# Patient Record
Sex: Male | Born: 1980 | Hispanic: Yes | Marital: Single | Smoking: Never smoker
Health system: Southern US, Community
[De-identification: ages and names within clinical notes are randomized; demographics above are authoritative.]

## PROBLEM LIST (undated history)

## (undated) DIAGNOSIS — M109 Gout, unspecified: Secondary | ICD-10-CM

## (undated) HISTORY — PX: FOREARM FRACTURE SURGERY: SHX649

---

## 2002-01-15 ENCOUNTER — Ambulatory Visit (HOSPITAL_BASED_OUTPATIENT_CLINIC_OR_DEPARTMENT_OTHER): Admission: RE | Admit: 2002-01-15 | Discharge: 2002-01-15 | Payer: Self-pay | Admitting: Orthopaedic Surgery

## 2013-09-14 ENCOUNTER — Ambulatory Visit (INDEPENDENT_AMBULATORY_CARE_PROVIDER_SITE_OTHER): Payer: Self-pay

## 2013-09-14 ENCOUNTER — Ambulatory Visit (INDEPENDENT_AMBULATORY_CARE_PROVIDER_SITE_OTHER): Payer: Self-pay | Admitting: Family Medicine

## 2013-09-14 VITALS — BP 122/76 | HR 70 | Temp 98.3°F | Resp 16 | Ht 65.5 in | Wt 191.0 lb

## 2013-09-14 DIAGNOSIS — R1012 Left upper quadrant pain: Secondary | ICD-10-CM

## 2013-09-14 DIAGNOSIS — M546 Pain in thoracic spine: Secondary | ICD-10-CM

## 2013-09-14 MED ORDER — OMEPRAZOLE 40 MG PO CPDR: 40.0000 mg | DELAYED_RELEASE_CAPSULE | Freq: Every day | ORAL | Status: DC

## 2013-09-14 NOTE — Patient Instructions (Signed)
Take the omeprazole 40 mg one time each day.  Things that often make reflux symptoms worse: Caffeine Carbonation (soda) Spicy foods Acidic foods (like tomato sauce, orange juice, lemonade) Fatty foods (including whole milk and ice cream) Stress (feeling sad, worried, nervous) Nicotine Alcohol NSAIDS (ibuprofen, naproxen)

## 2013-09-14 NOTE — Progress Notes (Signed)
Subjective:    Patient ID: Darren Rodgers, male    DOB: 1980/01/26, 33 y.o.   MRN: [id number]   PCP: No primary provider on file.  Chief Complaint  Patient presents with  . Abdominal Pain    x2-3 months, intermittent pain in LUQ.  . Back Pain    x2 weeks, mid to upper back pain on R side       Active Ambulatory Problems    Diagnosis Date Noted  . No Active Ambulatory Problems   Resolved Ambulatory Problems    Diagnosis Date Noted  . No Resolved Ambulatory Problems   No Additional Past Medical History    Past Surgical History  Procedure Laterality Date  . Forearm fracture surgery Left     No Known Allergies  Prior to Admission medications   Not on File    History   Social History  . Marital Status: Single    Spouse Name: n/a    Number of Children: 0  . Years of Education: 8th grade   Occupational History  . Construction    Social History Main Topics  . Smoking status: Never Smoker   . Smokeless tobacco: Never Used  . Alcohol Use: 5.0 oz/week    10 drink(s) per week  . Drug Use: No  . Sexual Activity: None   Other Topics Concern  . None   Social History Narrative   From [place] (near [place]). Came to the [place] in 2005. Lives with a friend.    family history is not on file. indicated that his mother is deceased. He indicated that his father is alive. He indicated that all of his four sisters are alive. He indicated that all of his five brothers are alive.   HPI  Presents for evaluation of LUQ abdominal pain and RIGHT shoulder/upper back pain. There is a considerable language barrier. When I speak to him in Spanish, he replies in [place] which is very difficult to understand.  1. 3-4 months of LUQ abdominal pain. Comes and goes.  Present some days, some not.  Pain is an ache, not sharp, burning or stabbing.  No aggravating or alleviating factors.  No heartburn or indigestion, belching, increased flatulence.  Reports normal, regular bowel movements. No  melena/hematochezia. Has tried nothing to treat his pain.  He reports he had some labs and xrays done several months ago, but he's not sure what they were other than cholesterol and reports that they were "all normal."   2. RIGHT upper back pain.  He points to the mid to lower scapula.  Pain with raising the arm. No other alleviating or aggravating factors.  Has tried nothing to treat his pain. Recalls no injury.  No pain in the arm, no numbness, tingling or weakness.  Neither of these complaints has altered his ability to work or perform any of his ADLs. He is concerned that these symptoms indicate he has an appendicitis, which is why he came today.  Review of Systems As above. No fever, chills, nausea, vomiting, diarrhea, urinary urgency, frequency or burning. No other muscle or joint pain.    Objective:   Physical Exam  Constitutional: He is oriented to person, place, and time. He appears well-developed and well-nourished. He is active and cooperative. No distress.  BP 122/76  Pulse 70  Temp(Src) 98.3 F (36.8 C) (Oral)  Resp 16  Ht 5' 5.5" (1.664 m)  Wt 191 lb (86.637 kg)  BMI 31.29 kg/m2  SpO2 96%   HENT:  Head: Normocephalic and atraumatic.  Eyes: Conjunctivae are normal. No scleral icterus.  Neck: Neck supple. No thyromegaly present.  Cardiovascular: Normal rate, regular rhythm and normal heart sounds.   Pulmonary/Chest: Effort normal and breath sounds normal.  Abdominal: Soft. He exhibits no distension and no mass. Bowel sounds are increased. There is no hepatosplenomegaly. There is tenderness (mild) in the left upper quadrant. There is no rebound, no guarding and no CVA tenderness.  Lymphadenopathy:    He has no cervical adenopathy.  Neurological: He is alert and oriented to person, place, and time.  Skin: Skin is warm and dry.  Psychiatric: He has a normal mood and affect. His speech is normal and behavior is normal.    ACUTE ABDOMINAL SERIES: [facility] reading (PRIMARY) by   Dr. Neva Seat. Non-specific bowel gas pattern. No free air. No ileus or evidence of obstruction.        Assessment & Plan:  1. Right-sided thoracic back pain 2. Abdominal pain, left upper quadrant Once I assured him that the appendix was unlikely to cause his symptoms, he was relieved and desired no additional evaluation.  I encouraged him to try Omeprazole 40 mg daily for 2 weeks, and to RTC if symptoms worsen/persist. If he does return, please bring the results of other studies done for evaluation and comparison. - DG Abd Acute W/Chest; Future - omeprazole (PRILOSEC) 40 MG capsule; Take 1 capsule (40 mg total) by mouth daily.  Dispense: 15 capsule; Refill: 0   Fernande Bras, PA-C Physician Assistant-Certified [facility]

## 2013-09-14 NOTE — Progress Notes (Signed)
Xray read and patient discussed with Chelle Jeffery, PA-C. . Agree with assessment and plan of care per her note.   

## 2015-04-18 ENCOUNTER — Emergency Department (HOSPITAL_COMMUNITY): Payer: Self-pay

## 2015-04-18 ENCOUNTER — Emergency Department (HOSPITAL_COMMUNITY)
Admission: EM | Admit: 2015-04-18 | Discharge: 2015-04-18 | Disposition: A | Discharge status: Home / Self Care | Payer: Self-pay | Attending: Emergency Medicine | Admitting: Emergency Medicine

## 2015-04-18 DIAGNOSIS — X58XXXA Exposure to other specified factors, initial encounter: Secondary | ICD-10-CM | POA: Insufficient documentation

## 2015-04-18 DIAGNOSIS — S39012A Strain of muscle, fascia and tendon of lower back, initial encounter: Secondary | ICD-10-CM | POA: Insufficient documentation

## 2015-04-18 DIAGNOSIS — Y9289 Other specified places as the place of occurrence of the external cause: Secondary | ICD-10-CM | POA: Insufficient documentation

## 2015-04-18 DIAGNOSIS — Y998 Other external cause status: Secondary | ICD-10-CM | POA: Insufficient documentation

## 2015-04-18 DIAGNOSIS — Y9389 Activity, other specified: Secondary | ICD-10-CM | POA: Insufficient documentation

## 2015-04-18 DIAGNOSIS — R109 Unspecified abdominal pain: Secondary | ICD-10-CM | POA: Insufficient documentation

## 2015-04-18 LAB — URINALYSIS, ROUTINE W REFLEX MICROSCOPIC
Bilirubin Urine: NEGATIVE
Glucose, UA: NEGATIVE mg/dL
HGB URINE DIPSTICK: NEGATIVE
Ketones, ur: NEGATIVE mg/dL
Leukocytes, UA: NEGATIVE
Nitrite: NEGATIVE
PROTEIN: NEGATIVE mg/dL
Specific Gravity, Urine: 1.018 (ref 1.005–1.030)
pH: 5.5 (ref 5.0–8.0)

## 2015-04-18 MED ORDER — NAPROXEN 500 MG PO TABS: 500.0000 mg | ORAL_TABLET | Freq: Two times a day (BID) | ORAL | Status: DC

## 2015-04-18 MED ORDER — METHOCARBAMOL 500 MG PO TABS: 1000.0000 mg | ORAL_TABLET | Freq: Four times a day (QID) | ORAL | Status: DC

## 2015-04-18 NOTE — ED Notes (Addendum)
Pt reports L flank pain for 4 days. No blood in urine. Pt request spanish interpreter.

## 2015-04-18 NOTE — ED Notes (Signed)
Pa  at bedside. 

## 2015-04-18 NOTE — ED Notes (Signed)
Patient transported to X-ray 

## 2015-04-18 NOTE — ED Notes (Signed)
Pa  at bedside. Using interpetor

## 2015-04-18 NOTE — ED Notes (Signed)
Registration at bedside.

## 2015-04-18 NOTE — ED Provider Notes (Signed)
CSN: [id number]     Arrival date & time 04/18/15  1008 History   First MD Initiated Contact with Patient 04/18/15 1028     Chief Complaint  Patient presents with  . Flank Pain     (Consider location/radiation/quality/duration/timing/severity/associated sxs/prior Treatment) HPI Comments: Patient presents with complaint of left sided low back pain for the past 2 weeks. Patient is a Corporate investment banker. He denies acute injuries. Patient had similar back pain 6 months ago. Pain is worsened with lifting and movement. He took an unknown over-the-counter pain medication without relief. Pain is sharp in nature. No radiation into legs. Patient denies warning symptoms of back pain including: fecal incontinence, urinary retention or overflow incontinence, night sweats, waking from sleep with back pain, unexplained fevers or weight loss, h/o cancer, IVDU, recent trauma.     The history is provided by the patient. The history is limited by a language barrier. A language interpreter was used (video interpreter).    No past medical history on file. Past Surgical History  Procedure Laterality Date  . Forearm fracture surgery Left    No family history on file. Social History  Substance Use Topics  . Smoking status: Never Smoker   . Smokeless tobacco: Never Used  . Alcohol Use: 5.0 oz/week    10 drink(s) per week    Review of Systems  Constitutional: Negative for fever and unexpected weight change.  Gastrointestinal: Negative for constipation.       Neg for fecal incontinence  Genitourinary: Negative for hematuria, flank pain and difficulty urinating.       Negative for urinary incontinence or retention  Musculoskeletal: Positive for back pain.  Neurological: Negative for weakness and numbness.       Negative for saddle paresthesias       Allergies  Review of patient's allergies indicates no known allergies.  Home Medications   Prior to Admission medications   Medication Sig Start Date  End Date Taking? Authorizing Provider  polyvinyl alcohol (LIQUIFILM TEARS) 1.4 % ophthalmic solution Place 1 drop into both eyes daily as needed for dry eyes.   Yes Historical Provider, MD  omeprazole (PRILOSEC) 40 MG capsule Take 1 capsule (40 mg total) by mouth daily. Patient not taking: Reported on 04/18/2015 09/14/13   Chelle Jeffery, PA-C   BP 113/93 mmHg  Pulse 87  Temp(Src) 98 F (36.7 C) (Oral)  Resp 17  SpO2 97% Physical Exam  Constitutional: He appears well-developed and well-nourished.  HENT:  Head: Normocephalic and atraumatic.  Eyes: Conjunctivae are normal.  Neck: Normal range of motion.  Abdominal: Soft. There is no tenderness. There is no CVA tenderness.  Musculoskeletal: Normal range of motion.       Cervical back: Normal.       Thoracic back: Normal.       Lumbar back: He exhibits tenderness.       Back:  No step-off noted with palpation of spine.   Neurological: He is alert. He has normal reflexes. No sensory deficit. He exhibits normal muscle tone.  5/5 strength in entire lower extremities bilaterally. No sensation deficit.   Skin: Skin is warm and dry.  Psychiatric: He has a normal mood and affect.  Nursing note and vitals reviewed.   ED Course  Procedures (including critical care time) Labs Review Labs Reviewed  URINALYSIS, ROUTINE W REFLEX MICROSCOPIC (NOT AT [facility])    Imaging Review Dg Lumbar Spine Complete  04/18/2015  CLINICAL DATA:  Left flank pain for 1 week after lifting  injury at work. EXAM: LUMBAR SPINE - COMPLETE 4+ VIEW COMPARISON:  None. FINDINGS: No fracture.  No spondylolisthesis. Mild loss of disc height from L2-L3 through L4-L5. Minor endplate spurring. Facet joints are well preserved. Soft tissues are unremarkable. IMPRESSION: 1. No fracture or acute finding. 2. Mild degenerative changes. Electronically Signed   By: Amie Portlandavid  Ormond M.D.   On: 04/18/2015 11:51   I have personally reviewed and evaluated these images and lab results as part of my  medical decision-making.  11:09 AM Patient seen and examined. Used video interpreter. Patient requests x-ray of his back as he is concerned about his pain. I discussed that patient likely has muscle strain and that x-ray will likely be negative. Patient continues to request x-ray so will order for patient's peace of mind.   Vital signs reviewed and are as follows: BP 113/93 mmHg  Pulse 87  Temp(Src) 98 F (36.7 C) (Oral)  Resp 17  SpO2 97%   11:58 AM X-ray shows mild denegerative changes, no fractures. No red flag s/s of low back pain. Patient was counseled on back pain precautions and told to do activity as tolerated but do not lift, push, or pull heavy objects more than 10 pounds for the next week.  Patient counseled to use ice or heat on back for no longer than 15 minutes every hour.   Patient prescribed muscle relaxer and counseled on proper use of muscle relaxant medication.    Urged patient not to drink alcohol, drive, or perform any other activities that requires focus while taking these medications.  Patient urged to follow-up with PCP if pain does not improve with treatment and rest or if pain becomes recurrent. Urged to return with worsening severe pain, loss of bowel or bladder control, trouble walking.   The patient verbalizes understanding and agrees with the plan.    MDM   Final diagnoses:  Lumbosacral strain, initial encounter   Patient with back pain. Neg x-ray for acute injury. No neurological deficits. Patient is ambulatory. No warning symptoms of back pain including: fecal incontinence, urinary retention or overflow incontinence, night sweats, waking from sleep with back pain, unexplained fevers or weight loss, h/o cancer, IVDU, recent trauma. No concern for cauda equina, epidural abscess, or other serious cause of back pain. Conservative measures such as rest, ice/heat and pain medicine indicated with PCP follow-up if no improvement with conservative management.      Renne CriglerJoshua Takhia Spoon, PA-C 04/18/15 1159  Vanetta MuldersScott Zackowski, MD 04/19/15 [phone]

## 2015-04-18 NOTE — Discharge Instructions (Signed)
Please read and follow all provided instructions.  Your diagnoses today include:  1. Lumbosacral strain, initial encounter    Tests performed today include:  Vital signs - see below for your results today  X-ray - shows some mild degeneration but no broken bones  Medications prescribed:   Naproxen - anti-inflammatory pain medication  Do not exceed 500mg  naproxen every 12 hours, take with food  You have been prescribed an anti-inflammatory medication or NSAID. Take with food. Take smallest effective dose for the shortest duration needed for your pain. Stop taking if you experience stomach pain or vomiting.    Robaxin (methocarbamol) - muscle relaxer medication  DO NOT drive or perform any activities that require you to be awake and alert because this medicine can make you drowsy.   Take any prescribed medications only as directed.  Home care instructions:   Follow any educational materials contained in this packet  Please rest, use ice or heat on your back for the next several days  Do not lift, push, pull anything more than 10 pounds for the next week  Follow-up instructions: Please follow-up with your primary care provider in the next 1 week for further evaluation of your symptoms.   Return instructions:  SEEK IMMEDIATE MEDICAL ATTENTION IF YOU HAVE:  New numbness, tingling, weakness, or problem with the use of your arms or legs  Severe back pain not relieved with medications  Loss control of your bowels or bladder  Increasing pain in any areas of the body (such as chest or abdominal pain)  Shortness of breath, dizziness, or fainting.   Worsening nausea (feeling sick to your stomach), vomiting, fever, or sweats  Any other emergent concerns regarding your health   Additional Information:  Your vital signs today were: BP 113/93 mmHg   Pulse 87   Temp(Src) 98 F (36.7 C) (Oral)   Resp 17   SpO2 97% If your blood pressure (BP) was elevated above 135/85 this  visit, please have this repeated by your doctor within one month. --------------

## 2016-09-10 ENCOUNTER — Ambulatory Visit (HOSPITAL_COMMUNITY)
Admission: EM | Admit: 2016-09-10 | Discharge: 2016-09-10 | Disposition: A | Discharge status: Home / Self Care | Payer: Self-pay | Attending: Internal Medicine | Admitting: Internal Medicine

## 2016-09-10 ENCOUNTER — Encounter (HOSPITAL_COMMUNITY): Payer: Self-pay | Admitting: Family Medicine

## 2016-09-10 DIAGNOSIS — H66001 Acute suppurative otitis media without spontaneous rupture of ear drum, right ear: Secondary | ICD-10-CM

## 2016-09-10 MED ORDER — AMOXICILLIN 500 MG PO CAPS: 1000.0000 mg | ORAL_CAPSULE | Freq: Three times a day (TID) | ORAL | 0 refills | Status: AC

## 2016-09-10 NOTE — ED Provider Notes (Signed)
[facility]    CSN: [id number] Arrival date & time: 09/10/16  1212     History   Chief Complaint Chief Complaint  Patient presents with  . Otalgia    HPI Darren Rodgers is a 36 y.o. male.   Presents today for bilateral otalgia 4 days, right ear is worse than the left. Denies any hearing loss, tinnitus or ear drainage. Denies any coughing, nasal congestion, sneezing, sore throat or headache. Also denies dental pain. No fever reported at home.        History reviewed. No pertinent past medical history.  There are no active problems to display for this patient.   Past Surgical History:  Procedure Laterality Date  . FOREARM FRACTURE SURGERY Left        Home Medications    Prior to Admission medications   Medication Sig Start Date End Date Taking? Authorizing Provider  amoxicillin (AMOXIL) 500 MG capsule Take 2 capsules (1,000 mg total) by mouth 3 (three) times daily. 09/10/16 09/17/16  Lucia EstelleZheng, Montee Tallman, NP  methocarbamol (ROBAXIN) 500 MG tablet Take 2 tablets (1,000 mg total) by mouth 4 (four) times daily. 04/18/15   Renne CriglerGeiple, Joshua, PA-C  naproxen (NAPROSYN) 500 MG tablet Take 1 tablet (500 mg total) by mouth 2 (two) times daily. 04/18/15   Renne CriglerGeiple, Joshua, PA-C  polyvinyl alcohol (LIQUIFILM TEARS) 1.4 % ophthalmic solution Place 1 drop into both eyes daily as needed for dry eyes.    [provider]    Family History History reviewed. No pertinent family history.  Social History Social History  Substance Use Topics  . Smoking status: Never Smoker  . Smokeless tobacco: Never Used  . Alcohol use 5.0 oz/week    10 drink(s) per week     Allergies   Patient has no known allergies.   Review of Systems Review of Systems  All other systems reviewed and are negative.    Physical Exam Triage Vital Signs ED Triage Vitals [09/10/16 1252]  Enc Vitals Group     BP 128/67     Pulse Rate 67     Resp 18     Temp 98.6 F (37 C)     Temp src      SpO2  97 %     Weight      Height      Head Circumference      Peak Flow      Pain Score      Pain Loc      Pain Edu?      Excl. in GC?    No data found.   Updated Vital Signs BP 128/67   Pulse 67   Temp 98.6 F (37 C)   Resp 18   SpO2 97%   Visual Acuity Right Eye Distance:   Left Eye Distance:   Bilateral Distance:    Right Eye Near:   Left Eye Near:    Bilateral Near:     Physical Exam  Constitutional: He is oriented to person, place, and time. He appears well-developed and well-nourished.  HENT:  Head: Normocephalic and atraumatic.  Nose: Nose normal.  Mouth/Throat: Oropharynx is clear and moist. No oropharyngeal exudate.  Left ear: Normal Right ear: TM is erythematous but no bulging or perforation.  Cardiovascular: Normal rate, regular rhythm and normal heart sounds.   Pulmonary/Chest: Effort normal and breath sounds normal. He has no wheezes.  Neurological: He is alert and oriented to person, place, and time.  Nursing note and vitals  reviewed.    UC Treatments / Results  Labs (all labs ordered are listed, but only abnormal results are displayed) Labs Reviewed - No data to display  EKG  EKG Interpretation None       Radiology No results found.  Procedures Procedures (including critical care time)  Medications Ordered in UC Medications - No data to display   Initial Impression / Assessment and Plan / UC Course  I have reviewed the triage vital signs and the nursing notes.  Pertinent labs & imaging results that were available during my care of the patient were reviewed by me and considered in my medical decision making (see chart for details).     Final Clinical Impressions(s) / UC Diagnoses   Final diagnoses:  Acute suppurative otitis media of right ear without spontaneous rupture of tympanic membrane, recurrence not specified   Prescriptions given (see below). Reviewed directions for usage and side effects. Patient states understanding and  will call with questions or problems. Patient instructed to call or follow up with his/her primary care doctor if failure to improve or change in symptoms. Discharge instruction given.   New Prescriptions New Prescriptions   AMOXICILLIN (AMOXIL) 500 MG CAPSULE    Take 2 capsules (1,000 mg total) by mouth 3 (three) times daily.     Controlled Substance Prescriptions [place] Controlled Substance Registry consulted? Not Applicable   Lucia Estelle, NP 09/10/16 1313

## 2016-09-10 NOTE — ED Triage Notes (Signed)
Pt here for bilateral ear pain x 4 days. Worse on the right.

## 2016-10-04 ENCOUNTER — Ambulatory Visit (HOSPITAL_COMMUNITY)
Admission: EM | Admit: 2016-10-04 | Discharge: 2016-10-04 | Disposition: A | Discharge status: Home / Self Care | Payer: Self-pay | Attending: Urgent Care | Admitting: Urgent Care

## 2016-10-04 ENCOUNTER — Encounter (HOSPITAL_COMMUNITY): Payer: Self-pay | Admitting: Emergency Medicine

## 2016-10-04 DIAGNOSIS — M79675 Pain in left toe(s): Secondary | ICD-10-CM | POA: Insufficient documentation

## 2016-10-04 DIAGNOSIS — R2242 Localized swelling, mass and lump, left lower limb: Secondary | ICD-10-CM

## 2016-10-04 DIAGNOSIS — M79672 Pain in left foot: Secondary | ICD-10-CM

## 2016-10-04 DIAGNOSIS — M7989 Other specified soft tissue disorders: Secondary | ICD-10-CM | POA: Insufficient documentation

## 2016-10-04 LAB — POCT I-STAT, CHEM 8
BUN: 14 mg/dL (ref 6–20)
CHLORIDE: 103 mmol/L (ref 101–111)
CREATININE: 0.9 mg/dL (ref 0.61–1.24)
Calcium, Ion: 1.14 mmol/L — ABNORMAL LOW (ref 1.15–1.40)
Glucose, Bld: 105 mg/dL — ABNORMAL HIGH (ref 65–99)
HEMATOCRIT: 44 % (ref 39.0–52.0)
Hemoglobin: 15 g/dL (ref 13.0–17.0)
POTASSIUM: 3.9 mmol/L (ref 3.5–5.1)
Sodium: 141 mmol/L (ref 135–145)
TCO2: 26 mmol/L (ref 22–32)

## 2016-10-04 LAB — URIC ACID: Uric Acid, Serum: 10 mg/dL — ABNORMAL HIGH (ref 4.4–7.6)

## 2016-10-04 MED ORDER — COLCHICINE 0.6 MG PO TABS: ORAL_TABLET | ORAL | 1 refills | Status: DC

## 2016-10-04 NOTE — ED Triage Notes (Signed)
Pt. Stated, left foot pain, start this morning

## 2016-10-04 NOTE — ED Provider Notes (Signed)
    MRN: [id number] DOB: Oct 20, 1980  Subjective:   Darren Rodgers is a 36 y.o. male presenting for chief complaint of Foot Pain  Reports sudden onset of constant, worsening, sharp pain over dorsum of his left foot. Denies fever, drainage of pus, bleeding, trauma. Denies history of gout. He thought it might have been an insect bite but does not recall any particular incident. Patient binge drinks on the weekends, eats a lot of red meat.   Darren Rodgers is not currently taking any medications and has No Known Allergies.  Darren Rodgers  Also  has a past surgical history that includes Forearm fracture surgery (Left).  Objective:   Vitals: BP 124/89 (BP Location: Right Arm)   Pulse 70   Temp 99.1 F (37.3 C) (Oral)   Resp 17   Wt 209 lb (94.8 kg)   SpO2 100%   BMI 34.25 kg/m   Physical Exam  Constitutional: He is oriented to person, place, and time. He appears well-developed and well-nourished.  Cardiovascular: Normal rate.   Pulmonary/Chest: Effort normal.  Musculoskeletal:       Feet:  Neurological: He is alert and oriented to person, place, and time.   Results for orders placed or performed during the hospital encounter of 10/04/16 (from the past 24 hour(s))  I-STAT, chem 8     Status: Abnormal   Collection Time: 10/04/16  8:18 PM  Result Value Ref Range   Sodium 141 135 - 145 mmol/L   Potassium 3.9 3.5 - 5.1 mmol/L   Chloride 103 101 - 111 mmol/L   BUN 14 6 - 20 mg/dL   Creatinine, Ser 4.09 0.61 - 1.24 mg/dL   Glucose, Bld 811 (H) 65 - 99 mg/dL   Calcium, Ion 9.14 (L) 1.15 - 1.40 mmol/L   TCO2 26 22 - 32 mmol/L   Hemoglobin 15.0 13.0 - 17.0 g/dL   HCT 78.2 95.6 - 21.3 %   Assessment and Plan :   Great toe pain, left  Pain and swelling of toe of left foot  Physical exam findings and history consistent with gout. Counseled patient on diagnosis and treatment plan including dietary modifications. Return-to-clinic precautions discussed, patient verbalized understanding.    Wallis Bamberg, PA-C [facility]  10/04/2016  8:08 PM   Wallis Bamberg, PA-C 10/05/16 2027

## 2016-10-04 NOTE — ED Notes (Signed)
Patient discharged by provider.

## 2016-10-04 NOTE — Discharge Instructions (Signed)
Toma 2 pastillas ahorita. Y luego una pastilla mas en Georgianne Fick. No tomas mas de este medicamento hasta que pasen 3 dias.

## 2016-10-27 ENCOUNTER — Encounter (HOSPITAL_COMMUNITY): Payer: Self-pay | Admitting: *Deleted

## 2016-10-27 ENCOUNTER — Ambulatory Visit (HOSPITAL_COMMUNITY)
Admission: EM | Admit: 2016-10-27 | Discharge: 2016-10-27 | Disposition: A | Discharge status: Home / Self Care | Payer: Self-pay | Attending: Emergency Medicine | Admitting: Emergency Medicine

## 2016-10-27 DIAGNOSIS — M109 Gout, unspecified: Secondary | ICD-10-CM

## 2016-10-27 MED ORDER — PREDNISONE 20 MG PO TABS: ORAL_TABLET | ORAL | 0 refills | Status: DC

## 2016-10-27 MED ORDER — INDOMETHACIN ER 75 MG PO CPCR: 75.0000 mg | ORAL_CAPSULE | Freq: Two times a day (BID) | ORAL | 0 refills | Status: DC

## 2016-10-27 NOTE — ED Triage Notes (Signed)
Patient reports 1 month history of left foot pain. Patient reports pain with ambulation. Patient points to left big toe.

## 2016-10-27 NOTE — ED Provider Notes (Signed)
HPI  SUBJECTIVE:  Darren Rodgers is a 36 y.o. male who presents with  Nonmigratory, stabbing, constant pain in his left first MTP joint starting yesterday. He reports some erythema, swelling and hyperesthesias. No trauma to the foot, change in physical activity. No fevers. He tried colchicine with some improvement in his symptoms. Symptoms are worse with moving his toe. No fevers. He was seen here back in September for the exact same thing, thought to have gout. Uric acid was 10. States that he has been eating a lot of red meat recently. No alcohol in the past month. States this pain is identical to the pain that he had when he was presumptively diagnosed with gout. States it is not as severe this time because of the colchicine. No history of diabetes, hypertension, kidney disease. Family history negative for gout. PMD: None. All history obtained through language line.    History reviewed. No pertinent past medical history.  Past Surgical History:  Procedure Laterality Date  . FOREARM FRACTURE SURGERY Left     History reviewed. No pertinent family history.  Social History  Substance Use Topics  . Smoking status: Never Smoker  . Smokeless tobacco: Never Used  . Alcohol use 5.0 oz/week    10 drink(s) per week    No current facility-administered medications for this encounter.   Current Outpatient Prescriptions:  .  colchicine 0.6 MG tablet, Take 2 tablets now followed by 1 in an hour. Do not repeat this regimen for another 3 days., Disp: 30 tablet, Rfl: 1 .  indomethacin (INDOCIN SR) 75 MG CR capsule, Take 1 capsule (75 mg total) by mouth 2 (two) times daily with a meal., Disp: 21 capsule, Rfl: 0 .  polyvinyl alcohol (LIQUIFILM TEARS) 1.4 % ophthalmic solution, Place 1 drop into both eyes daily as needed for dry eyes., Disp: , Rfl:  .  predniSONE (DELTASONE) 20 MG tablet, 2 tabs po once daily x 3 days, Disp: 6 tablet, Rfl: 0  No Known Allergies   ROS  As noted in HPI.   Physical  Exam  BP (!) 134/94 (BP Location: Left Arm)   Pulse 60   Temp 98.4 F (36.9 C) (Oral)   Resp 17   SpO2 100%   Constitutional: Well developed, well nourished, no acute distress Eyes:  EOMI, conjunctiva normal bilaterally HENT: Normocephalic, atraumatic,mucus membranes moist Respiratory: Normal inspiratory effort Cardiovascular: Normal rate GI: nondistended skin: No rash, skin intact Musculoskeletal: no deformities. Pain, tenderness at the left first MTP. No appreciable erythema. Mild swelling. No increased temperature. Rest of foot exam normal. Neurologic: Alert & oriented x 3, no focal neuro deficits Psychiatric: Speech and behavior appropriate   ED Course   Medications - No data to display  No orders of the defined types were placed in this encounter.   No results found for this or any previous visit (from the past 24 hour(s)). No results found.  ED Clinical Impression  Acute gout involving toe of left foot, unspecified cause   ED Assessment/Plan  previous labs and records reviewed. As noted in history of present illness.  Today's Presentation consistent with gout. No evidence of septic joint. Imaging withheld due to absence of trauma.  Plan to send home with prednisone and indomethacin. He has plenty of colchicine and has a refill available. Explained how to take the colchicine to the patient using a translator. Will provide primary care referral for routine care..   Discussed labs,  MDM, plan and followup with patient. Patient agrees  with plan.   Meds ordered this encounter  Medications  . DISCONTD: indomethacin (INDOCIN SR) 75 MG CR capsule    Sig: Take 1 capsule (75 mg total) by mouth 2 (two) times daily with a meal.    Dispense:  21 capsule    Refill:  0  . predniSONE (DELTASONE) 20 MG tablet    Sig: 2 tabs po once daily x 3 days    Dispense:  6 tablet    Refill:  0  . indomethacin (INDOCIN SR) 75 MG CR capsule    Sig: Take 1 capsule (75 mg total) by  mouth 2 (two) times daily with a meal.    Dispense:  21 capsule    Refill:  0    *This clinic note was created using Scientist, clinical (histocompatibility and immunogenetics). Therefore, there may be occasional mistakes despite careful proofreading.  ?   Domenick Gong, MD 10/27/16 [phone]

## 2016-10-27 NOTE — Discharge Instructions (Signed)
Below is a list of primary care practices who are taking new patients for you to follow-up with. [facility] [address] [place], [place] [address] [phone]  [facility] [phone] [address] [place] [address]  [facility]: [address] [place] [id number]  [phone]  [facility]: [address] [place] [id number]   [phone]  [facility]: [address] [place] [address]  [phone]  [facility] : [address] [place] [phone]  [facility]: [address] [place] [phone]  [facility]: [address] [place] [id number] [phone]  Dr. Oneal GroutMahima Pandey [address] [facility] [address] [facility] [place] [id number]  [phone]  Dr. Jackie PlumGeorge Osei-Bonsu, [facility]. [address] [place], [place] [address]  [phone]  Go to www.goodrx.com to look up your medications. This will give you a list of where you can find your prescriptions at the most affordable prices. Or ask the pharmacist what the cash price is, or if they have any other discount programs available to help make your medication more affordable. This can be less expensive than what you would pay with insurance.    Dietary treatment plays a supplementary role in treatment of gout.  Diet can be expected to decrease the uric acid level by 10 to 15%.  Often medication can effect a much more substantial reduction.  An extremely restrictive diet is not necessary, but here are a few suggestions that might help decrease the frequency of gout attacks.  The first and most important measure is to achieve and maintain a healthy body weight  (BMI of 20 to 25).  It's been shown that people with a BMI over 25 have an increased risk of gout attacks compared to people with a BMI of less than 25.  Also, gout patients who lose as little as 4.5 kg or 9.9 lbs will decrease their risk of gout attacks.  Beyond that, here are a few general guidelines:  Eat less: Red meat Seafood Beer and hard alcohol (e.g. gin, vodka, whiskey) Foods that contain high fructose corn syrup (found in sweets and non-diet sodas) Organ meats (liver, kidneys, brains, sweetbreads) or foods made from these meats (hot dogs, bologna).  Eat more: Low fat dairy products A moderate amount of wine (up to two 5 oz servings per day) is not likely to increase the risk of a gout attack. Coffee may decrease the risk of gout attacks Vitamin C (500 mg per day) has a mild urate lowering effect

## 2016-10-27 NOTE — ED Notes (Addendum)
Patient still has some colchicine left, he was not sure if was expired or not.

## 2017-05-19 ENCOUNTER — Encounter (HOSPITAL_COMMUNITY): Payer: Self-pay | Admitting: Emergency Medicine

## 2017-05-19 ENCOUNTER — Ambulatory Visit (HOSPITAL_COMMUNITY)
Admission: EM | Admit: 2017-05-19 | Discharge: 2017-05-19 | Disposition: A | Discharge status: Home / Self Care | Payer: Self-pay | Attending: Family Medicine | Admitting: Family Medicine

## 2017-05-19 DIAGNOSIS — M25562 Pain in left knee: Secondary | ICD-10-CM

## 2017-05-19 DIAGNOSIS — M25462 Effusion, left knee: Secondary | ICD-10-CM

## 2017-05-19 DIAGNOSIS — Z8739 Personal history of other diseases of the musculoskeletal system and connective tissue: Secondary | ICD-10-CM

## 2017-05-19 MED ORDER — METHYLPREDNISOLONE ACETATE 40 MG/ML IJ SUSP
40.0000 mg | Freq: Once | INTRAMUSCULAR | Status: AC
  Administered 2017-05-19: 40 mg via INTRA_ARTICULAR

## 2017-05-19 MED ORDER — LIDOCAINE HCL (PF) 1 % IJ SOLN
INTRAMUSCULAR | Status: AC
  Filled 2017-05-19: qty 30

## 2017-05-19 MED ORDER — METHYLPREDNISOLONE ACETATE 80 MG/ML IJ SUSP
INTRAMUSCULAR | Status: AC
  Filled 2017-05-19: qty 1

## 2017-05-19 MED ORDER — IBUPROFEN 800 MG PO TABS: 800.0000 mg | ORAL_TABLET | Freq: Three times a day (TID) | ORAL | 0 refills | Status: DC | PRN

## 2017-05-19 NOTE — ED Triage Notes (Signed)
PT reports pain below left knee that started this AM. PT has had gout twice before. No injury to knee.

## 2017-05-19 NOTE — Discharge Instructions (Signed)
Ice to knee tonight Limit weight bearing to tolerance Watch for infection Return as needed

## 2017-05-19 NOTE — ED Provider Notes (Signed)
[facility]     CSN: [id number] Arrival date & time: 05/19/17  1638     History   Chief Complaint Chief Complaint  Patient presents with  . Knee Pain    HPI Darren Rodgers is a 37 y.o. male.   HPI   Patient interviewed with the assistance of a translator Patient has pain in his left knee.  Is been present for 2 days.  He can hardly bear weight.  The knee is swollen swollen. No injury.  No overuse. He states that he works in [employer]. He has had gout on 2 prior occasions.  Both of these were in his foot.  Never had gout in his knee.  He has tried indomethacin.  It is helped slightly.  He states he has swelling in his right knee for 5 years ago.  He got a cortisone shot.  Made it better very quickly.  He has any pain since.  He is requesting a cortisone shot.  History reviewed. No pertinent past medical history. No ongoing medical problems There are no active problems to display for this patient.   Past Surgical History:  Procedure Laterality Date  . FOREARM FRACTURE SURGERY Left        Home Medications    Prior to Admission medications   Medication Sig Start Date End Date Taking? Authorizing Provider  ibuprofen (ADVIL,MOTRIN) 800 MG tablet Take 1 tablet (800 mg total) by mouth every 8 (eight) hours as needed for moderate pain. 05/19/17   Eustace Moore, MD    Family History No family history on file.  Social History Social History   Tobacco Use  . Smoking status: Never Smoker  . Smokeless tobacco: Never Used  Substance Use Topics  . Alcohol use: Yes    Alcohol/week: 5.0 oz    Types: 10 drink(s) per week  . Drug use: No     Allergies   Patient has no known allergies.   Review of Systems Review of Systems  Constitutional: Negative for chills and fever.  HENT: Negative for ear pain and sore throat.   Eyes: Negative for pain and visual disturbance.  Respiratory: Negative for cough and shortness of breath.   Cardiovascular:  Negative for chest pain and palpitations.  Gastrointestinal: Negative for abdominal pain and vomiting.  Genitourinary: Negative for dysuria and hematuria.  Musculoskeletal: Positive for arthralgias, gait problem and joint swelling. Negative for back pain.  Skin: Negative for color change and rash.  Neurological: Negative for seizures and syncope.  All other systems reviewed and are negative.    Physical Exam Triage Vital Signs ED Triage Vitals  Enc Vitals Group     BP 05/19/17 1701 124/84     Pulse Rate 05/19/17 1701 92     Resp 05/19/17 1701 16     Temp 05/19/17 1701 98.3 F (36.8 C)     Temp Source 05/19/17 1701 Oral     SpO2 05/19/17 1701 99 %     Weight 05/19/17 1704 209 lb (94.8 kg)     Height --      Head Circumference --      Peak Flow --      Pain Score --      Pain Loc --      Pain Edu? --      Excl. in GC? --    No data found.  Updated Vital Signs BP 124/84 (BP Location: Left Arm)   Pulse 92   Temp 98.3 F (36.8 C) (  Oral)   Resp 16   Wt 209 lb (94.8 kg)   SpO2 99%   BMI 34.25 kg/m   \    Physical Exam  Constitutional: He appears well-developed and well-nourished.  HENT:  Head: Normocephalic and atraumatic.  Eyes: Conjunctivae are normal.  Neck: Neck supple.  Cardiovascular: Normal rate and regular rhythm.  No murmur heard. Pulmonary/Chest: Effort normal and breath sounds normal. No respiratory distress.  Musculoskeletal: He exhibits no edema.       Right knee: Normal.       Left knee: He exhibits decreased range of motion, swelling and effusion. He exhibits normal alignment and no LCL laxity. Tenderness found.  Tenderness medial joint line.  Limited flexion and joint line. Mild warmth  Neurological: He is alert.  Psychiatric: He has a normal mood and affect.  Nursing note and vitals reviewed. Time out Consent Appropriate site lateral (   L     )  knee prepped and marked Area infiltrated with a 1 cc 1% lidocaine wheal Knee joint injected with  3 cc of 1% lidocaine and 80 mg of DepoMedrol Patient tolerated procedure well Post injection instructions reviewed    UC Treatments / Results  Labs (all labs ordered are listed, but only abnormal results are displayed) Labs Reviewed - No data to display  EKG None  Radiology No results found.  Procedures Procedures (including critical care time)  Medications Ordered in UC Medications  methylPREDNISolone acetate (DEPO-MEDROL) injection 40 mg (40 mg Intra-articular Given 05/19/17 1749)    Initial Impression / Assessment and Plan / UC Course  I have reviewed the triage vital signs and the nursing notes.  Pertinent labs & imaging results that were available during my care of the patient were reviewed by me and considered in my medical decision making (see chart for details).      Final Clinical Impressions(s) / UC Diagnoses   Final diagnoses:  Acute pain of left knee  Effusion of left knee  History of gout     Discharge Instructions     Ice to knee tonight Limit weight bearing to tolerance Watch for infection Return as needed   ED Prescriptions    Medication Sig Dispense Auth. Provider   ibuprofen (ADVIL,MOTRIN) 800 MG tablet Take 1 tablet (800 mg total) by mouth every 8 (eight) hours as needed for moderate pain. 90 tablet Eustace Moore, MD     Controlled Substance Prescriptions  Controlled Substance Registry consulted? Not Applicable   Eustace Moore, MD 05/19/17 2127

## 2017-07-01 ENCOUNTER — Ambulatory Visit (HOSPITAL_COMMUNITY)
Admission: EM | Admit: 2017-07-01 | Discharge: 2017-07-01 | Disposition: A | Discharge status: Home / Self Care | Payer: Self-pay | Attending: Internal Medicine | Admitting: Internal Medicine

## 2017-07-01 ENCOUNTER — Encounter (HOSPITAL_COMMUNITY): Payer: Self-pay | Admitting: Emergency Medicine

## 2017-07-01 DIAGNOSIS — Z23 Encounter for immunization: Secondary | ICD-10-CM

## 2017-07-01 DIAGNOSIS — S91331A Puncture wound without foreign body, right foot, initial encounter: Secondary | ICD-10-CM

## 2017-07-01 DIAGNOSIS — W450XXA Nail entering through skin, initial encounter: Secondary | ICD-10-CM

## 2017-07-01 MED ORDER — LEVOFLOXACIN 750 MG PO TABS: 750.0000 mg | ORAL_TABLET | Freq: Every day | ORAL | 0 refills | Status: DC

## 2017-07-01 MED ORDER — TETANUS-DIPHTH-ACELL PERTUSSIS 5-2.5-18.5 LF-MCG/0.5 IM SUSP
INTRAMUSCULAR | Status: AC
  Filled 2017-07-01: qty 0.5

## 2017-07-01 MED ORDER — TETANUS-DIPHTH-ACELL PERTUSSIS 5-2.5-18.5 LF-MCG/0.5 IM SUSP
0.5000 mL | Freq: Once | INTRAMUSCULAR | Status: AC
  Administered 2017-07-01: 0.5 mL via INTRAMUSCULAR

## 2017-07-01 MED ORDER — LEVOFLOXACIN 750 MG PO TABS: 750.0000 mg | ORAL_TABLET | Freq: Every day | ORAL | 0 refills | Status: AC

## 2017-07-01 MED ORDER — NAPROXEN 375 MG PO TABS: 375.0000 mg | ORAL_TABLET | Freq: Two times a day (BID) | ORAL | 0 refills | Status: DC

## 2017-07-01 NOTE — ED Provider Notes (Signed)
[facility]   [id number] 07/01/17 Arrival Time: 1813  SUBJECTIVE: History from: interpretor Darren Rodgers is a 37 y.o. male complains of right foot that began 3 days ago.  It began after stepping on a nail.  States he did not step all the way down on the nail, just enough to puncture the superficial layer of skin.  He is not concerned for retained foreign body. Localizes the pain to the bottom of foot.  Describes the pain as constant and achy in character.  Symptoms are made worse with walking and weight bearing.  Denies similar symptoms in the past.  Denies fever, chills, erythema, ecchymosis, effusion, weakness, numbness and tingling.      Last tetanus shot >10 years ago  ROS: As per HPI.  History reviewed. No pertinent past medical history. Past Surgical History:  Procedure Laterality Date  . FOREARM FRACTURE SURGERY Left    No Known Allergies No current facility-administered medications on file prior to encounter.    Current Outpatient Medications on File Prior to Encounter  Medication Sig Dispense Refill  . ibuprofen (ADVIL,MOTRIN) 800 MG tablet Take 1 tablet (800 mg total) by mouth every 8 (eight) hours as needed for moderate pain. 90 tablet 0   Social History   Socioeconomic History  . Marital status: Single    Spouse name: n/a  . Number of children: 0  . Years of education: 8th grade  . Highest education level: Not on file  Occupational History  . Occupation: Training and development officer  . Financial resource strain: Not on file  . Food insecurity:    Worry: Not on file    Inability: Not on file  . Transportation needs:    Medical: Not on file    Non-medical: Not on file  Tobacco Use  . Smoking status: Never Smoker  . Smokeless tobacco: Never Used  Substance and Sexual Activity  . Alcohol use: Yes    Alcohol/week: 6.0 oz    Types: 10 drink(s) per week  . Drug use: No  . Sexual activity: Not on file  Lifestyle  . Physical activity:    Days  per week: Not on file    Minutes per session: Not on file  . Stress: Not on file  Relationships  . Social connections:    Talks on phone: Not on file    Gets together: Not on file    Attends religious service: Not on file    Active member of club or organization: Not on file    Attends meetings of clubs or organizations: Not on file    Relationship status: Not on file  . Intimate partner violence:    Fear of current or ex partner: Not on file    Emotionally abused: Not on file    Physically abused: Not on file    Forced sexual activity: Not on file  Other Topics Concern  . Not on file  Social History Narrative   From [place]near [place] Came to the [place] in 2005. Lives with a friend.   History reviewed. No pertinent family history.  OBJECTIVE:  Vitals:   07/01/17 1849  BP: 123/80  Pulse: 66  Resp: 16  Temp: 98.3 F (36.8 C)  TempSrc: Oral  SpO2: 97%    General appearance: AOx3; in no acute distress.  Head: NCAT Lungs: CTA bilaterally Heart: RRR.  Clear S1 and S2 without murmur, gallops, or rubs.  Radial pulses 2+ bilaterally. Musculoskeletal: Right foot Inspection: Superficial puncture wound  healing well without obvious signs of erythema, swelling, or crepitus.  No active drainage Palpation: Mild to no tenderness to palpation ROM: FROM active and passive Skin: warm and dry Neurologic: antalgic gait; Sensation intact about the lower extremities Psychological: alert and cooperative; normal mood and affect  ASSESSMENT & PLAN:  1. Puncture wound of plantar aspect of right foot, initial encounter     @NIMG @  Meds ordered this encounter  Medications  . Tdap (BOOSTRIX) injection 0.5 mL  . DISCONTD: levofloxacin (LEVAQUIN) 750 MG tablet    Sig: Take 1 tablet (750 mg total) by mouth daily for 5 days.    Dispense:  5 tablet    Refill:  0    Order Specific Question:   Supervising Provider    Answer:   Isa RankinMURRAY, LAURA WILSON [phone][[id number]]  . DISCONTD: naproxen (NAPROSYN)  375 MG tablet    Sig: Take 1 tablet (375 mg total) by mouth 2 (two) times daily.    Dispense:  20 tablet    Refill:  0    Order Specific Question:   Supervising Provider    Answer:   Isa RankinMURRAY, LAURA WILSON [phone][[id number]]  . levofloxacin (LEVAQUIN) 750 MG tablet    Sig: Take 1 tablet (750 mg total) by mouth daily for 5 days.    Dispense:  5 tablet    Refill:  0    Order Specific Question:   Supervising Provider    Answer:   Isa RankinMURRAY, LAURA WILSON [phone][[id number]]  . naproxen (NAPROSYN) 375 MG tablet    Sig: Take 1 tablet (375 mg total) by mouth 2 (two) times daily.    Dispense:  20 tablet    Refill:  0    Order Specific Question:   Supervising Provider    Answer:   Isa RankinMURRAY, LAURA WILSON [[id number]][[id number]]    Declines x-ray today. Not concerned for FB Tetanus updated Keep site clean and dry Rest, ice and elevate Naproxen prescribed as needed for symptomatic relief Antibiotics prescribed for help prevent infection.  Take as directed and to completion Follow up with PCP if symptoms persists Return or go to the ED if you have any new or worsening symptoms  Reviewed expectations re: course of current medical issues. Questions answered. Outlined signs and symptoms indicating need for more acute intervention. Patient verbalized understanding. After Visit Summary given.    Rennis HardingWurst, Darren Cabiness, PA-C 07/01/17 1926

## 2017-07-01 NOTE — ED Notes (Signed)
Pt discharged by provider.

## 2017-07-01 NOTE — Discharge Instructions (Addendum)
Declines x-ray today. Not concerned for FB Tetanus updated Keep site clean and dry Rest, ice and elevate Naproxen prescribed as needed for symptomatic relief Antibiotics prescribed for help prevent infection.  Take as directed and to completion Follow up with PCP if symptoms persists Return or go to the ED if you have any new or worsening symptoms   Ttanos actualizado Mantener el sitio limpio y Secretary/administratorseco Descanso, hielo y elevacin. Naproxeno prescrito segn sea necesario para el alivio sintomtico. Antibiticos recetados para ayudar a prevenir infecciones. Tomar como se indica y Photographerhasta completar Seguimiento con PCP si los sntomas persisten. Regrese o vaya a la sala de emergencias si tiene sntomas nuevos o que empeoran.

## 2017-07-01 NOTE — ED Triage Notes (Signed)
The patient triaged by provider.

## 2017-12-25 ENCOUNTER — Encounter (HOSPITAL_COMMUNITY): Payer: Self-pay | Admitting: Emergency Medicine

## 2017-12-25 ENCOUNTER — Ambulatory Visit (HOSPITAL_COMMUNITY)
Admission: EM | Admit: 2017-12-25 | Discharge: 2017-12-25 | Disposition: A | Discharge status: Home / Self Care | Payer: Self-pay | Attending: Family Medicine | Admitting: Family Medicine

## 2017-12-25 DIAGNOSIS — M5432 Sciatica, left side: Secondary | ICD-10-CM | POA: Insufficient documentation

## 2017-12-25 MED ORDER — NAPROXEN 500 MG PO TABS: 500.0000 mg | ORAL_TABLET | Freq: Two times a day (BID) | ORAL | 0 refills | Status: DC

## 2017-12-25 MED ORDER — CYCLOBENZAPRINE HCL 10 MG PO TABS: 10.0000 mg | ORAL_TABLET | Freq: Two times a day (BID) | ORAL | 0 refills | Status: DC | PRN

## 2017-12-25 NOTE — ED Triage Notes (Signed)
With spanish interpreter, "I am having back pain on the left side for 3 days. When I got home from work last week, I stood up and felt something pop and since then i've been having the pain." Denies issues with urination.

## 2017-12-25 NOTE — ED Provider Notes (Signed)
[facility]    CSN: [id number] Arrival date & time: 12/25/17  1041     History   Chief Complaint Chief Complaint  Patient presents with  . Back Pain    HPI Darren Rodgers is a 37 y.o. male who presents to the UC with c/o back pain. The pain started on the left side 3 days ago. Patient reports that he got home from work last week and stood up and felt something pop and since then the pain has been constant. Patient denies UTI symptoms.   HPI  History reviewed. No pertinent past medical history.  There are no active problems to display for this patient.   Past Surgical History:  Procedure Laterality Date  . FOREARM FRACTURE SURGERY Left        Home Medications    Prior to Admission medications   Medication Sig Start Date End Date Taking? Authorizing Provider  cyclobenzaprine (FLEXERIL) 10 MG tablet Take 1 tablet (10 mg total) by mouth 2 (two) times daily as needed for muscle spasms. 12/25/17   Janne Napoleon, NP  naproxen (NAPROSYN) 500 MG tablet Take 1 tablet (500 mg total) by mouth 2 (two) times daily. 12/25/17   Janne Napoleon, NP    Family History No family history on file.  Social History Social History   Tobacco Use  . Smoking status: Never Smoker  . Smokeless tobacco: Never Used  Substance Use Topics  . Alcohol use: Yes    Alcohol/week: 10.0 standard drinks    Types: 10 drink(s) per week  . Drug use: No     Allergies   Patient has no known allergies.   Review of Systems Review of Systems  Musculoskeletal: Positive for back pain.  All other systems reviewed and are negative.    Physical Exam Triage Vital Signs ED Triage Vitals  Enc Vitals Group     BP 12/25/17 1117 116/74     Pulse Rate 12/25/17 1117 76     Resp 12/25/17 1117 14     Temp 12/25/17 1117 97.7 F (36.5 C)     Temp src --      SpO2 12/25/17 1117 99 %     Weight --      Height --      Head Circumference --      Peak Flow --      Pain Score 12/25/17 1119 8     Pain Loc --      Pain Edu? --      Excl. in GC? --    No data found.  Updated Vital Signs BP 116/74   Pulse 76   Temp 97.7 F (36.5 C)   Resp 14   SpO2 99%   Visual Acuity Right Eye Distance:   Left Eye Distance:   Bilateral Distance:    Right Eye Near:   Left Eye Near:    Bilateral Near:     Physical Exam Vitals signs and nursing note reviewed.  Constitutional:      General: He is not in acute distress.    Appearance: He is well-developed.  HENT:     Head: Normocephalic.  Eyes:     Extraocular Movements: Extraocular movements intact.  Neck:     Musculoskeletal: Neck supple.  Cardiovascular:     Rate and Rhythm: Normal rate.  Pulmonary:     Effort: Pulmonary effort is normal.  Abdominal:     Palpations: Abdomen is soft.     Tenderness: There is  no abdominal tenderness. There is no right CVA tenderness or left CVA tenderness.  Musculoskeletal:     Lumbar back: He exhibits tenderness and spasm. He exhibits normal range of motion, no deformity and normal pulse.       Back:     Comments: Pedal pulses 2+, straight leg raises without difficulty. No tenderness over the spine. Point tenderness over the left lower lumbar area.   Skin:    General: Skin is warm and dry.  Neurological:     Mental Status: He is alert.     Motor: Motor function is intact.     Gait: Gait normal.     Deep Tendon Reflexes:     Reflex Scores:      Bicep reflexes are 2+ on the left side.      Brachioradialis reflexes are 2+ on the right side and 2+ on the left side.      Patellar reflexes are 2+ on the right side and 2+ on the left side. Psychiatric:        Mood and Affect: Mood normal.      UC Treatments / Results  Labs (all labs ordered are listed, but only abnormal results are displayed) Labs Reviewed - No data to display  Radiology No results found.  Procedures Procedures (including critical care time)  Medications Ordered in UC Medications - No data to display  Initial  Impression / Assessment and Plan / UC Course  I have reviewed the triage vital signs and the nursing notes. Patient with back pain.  No neurological deficits and normal neuro exam.  Patient can walk but states is painful.  No loss of bowel or bladder control.  No concern for cauda equina.  No fever, night sweats, weight loss, h/o cancer, IVDU.  RICE protocol and pain medicine indicated and discussed with patient.  Final Clinical Impressions(s) / UC Diagnoses   Final diagnoses:  Sciatica, left side   Discharge Instructions   None    ED Prescriptions    Medication Sig Dispense Auth. Provider   cyclobenzaprine (FLEXERIL) 10 MG tablet Take 1 tablet (10 mg total) by mouth 2 (two) times daily as needed for muscle spasms. 20 tablet Kerrie BuffaloNeese, Hope M, NP   naproxen (NAPROSYN) 500 MG tablet Take 1 tablet (500 mg total) by mouth 2 (two) times daily. 20 tablet Janne NapoleonNeese, Hope M, NP     Controlled Substance Prescriptions [place] Controlled Substance Registry consulted? Not Applicable   Janne Napoleoneese, Hope M, [place] 12/25/17 1156

## 2018-01-06 ENCOUNTER — Emergency Department (HOSPITAL_COMMUNITY): Payer: Self-pay

## 2018-01-06 ENCOUNTER — Emergency Department (HOSPITAL_COMMUNITY)
Admission: EM | Admit: 2018-01-06 | Discharge: 2018-01-06 | Disposition: A | Discharge status: Home / Self Care | Payer: Self-pay | Attending: Emergency Medicine | Admitting: Emergency Medicine

## 2018-01-06 DIAGNOSIS — N50812 Left testicular pain: Secondary | ICD-10-CM | POA: Insufficient documentation

## 2018-01-06 DIAGNOSIS — M545 Low back pain, unspecified: Secondary | ICD-10-CM

## 2018-01-06 LAB — URINALYSIS, ROUTINE W REFLEX MICROSCOPIC
BACTERIA UA: NONE SEEN
Bilirubin Urine: NEGATIVE
Glucose, UA: NEGATIVE mg/dL
Ketones, ur: NEGATIVE mg/dL
Leukocytes, UA: NEGATIVE
Nitrite: NEGATIVE
Protein, ur: NEGATIVE mg/dL
SPECIFIC GRAVITY, URINE: 1.024 (ref 1.005–1.030)
pH: 5 (ref 5.0–8.0)

## 2018-01-06 NOTE — ED Notes (Signed)
Pt back from Ultrasound

## 2018-01-06 NOTE — ED Triage Notes (Signed)
Pt endorses left lower back pain x 1 week, worse with movement. Pt thinks it's related to work. Denies urinary sx. VSS

## 2018-01-06 NOTE — ED Notes (Signed)
Patient transported to Ultrasound 

## 2018-01-06 NOTE — ED Provider Notes (Signed)
[facility]   CSN: [id number] Arrival date & time: 01/06/18  [id number]     History   Chief Complaint Chief Complaint  Patient presents with  . Back Pain    HPI Darren Rodgers is a 37 y.o. male presenting to the ED for subsequent visit regarding left sided back pain. [facility] for this and prescribed naprosyn and flexeril. He states this has not helped his symptoms. Symptoms initially started a few days prior to initial UC visit, states he was sitting on a couch and when he stood up he felt pain.  Pain in his back has been worse with sitting, described as dull and similar to previous back injury.  He states 3 days ago he developed left testicular pain as well which she thinks is coming from his back.  Denies urinary symptoms, abdominal pain, numbness or weakness in extremities, bowel or bladder incontinence.  He is currently sexually active without protection.  The history is provided by the patient and medical records.    No past medical history on file.  There are no active problems to display for this patient.   Past Surgical History:  Procedure Laterality Date  . FOREARM FRACTURE SURGERY Left         Home Medications    Prior to Admission medications   Medication Sig Start Date End Date Taking? Authorizing Provider  cyclobenzaprine (FLEXERIL) 10 MG tablet Take 1 tablet (10 mg total) by mouth 2 (two) times daily as needed for muscle spasms. 12/25/17   Janne NapoleonNeese, Hope M, NP  naproxen (NAPROSYN) 500 MG tablet Take 1 tablet (500 mg total) by mouth 2 (two) times daily. 12/25/17   Janne NapoleonNeese, Hope M, NP    Family History No family history on file.  Social History Social History   Tobacco Use  . Smoking status: Never Smoker  . Smokeless tobacco: Never Used  Substance Use Topics  . Alcohol use: Yes    Alcohol/week: 10.0 standard drinks    Types: 10 drink(s) per week  . Drug use: No      Allergies   Patient has no known allergies.   Review of Systems Review of Systems  Gastrointestinal:       No bowel incontinence  Genitourinary: Positive for testicular pain. Negative for difficulty urinating, discharge, dysuria, frequency, hematuria, penile pain, penile swelling and scrotal swelling.  Musculoskeletal: Positive for back pain.  Neurological: Negative for weakness and numbness.     Physical Exam Updated Vital Signs BP 115/80   Pulse 82   Temp 98.8 F (37.1 C)   Resp 18   SpO2 98%   Physical Exam Vitals signs and nursing note reviewed. Exam conducted with a chaperone present.  Constitutional:      Appearance: He is well-developed. He is not ill-appearing or toxic-appearing.  HENT:     Head: Normocephalic and atraumatic.  Eyes:     Conjunctiva/sclera: Conjunctivae normal.  Cardiovascular:     Rate and Rhythm: Normal rate and regular rhythm.  Pulmonary:     Effort: Pulmonary effort is normal.     Breath sounds: Normal breath sounds.  Abdominal:     General: Bowel sounds are normal. There is no distension.     Palpations: Abdomen is soft. There is no mass.     Tenderness: There is no abdominal tenderness. There is no right CVA tenderness or left CVA tenderness.  Genitourinary:    Penis: Normal  and uncircumcised. No tenderness or discharge.      Scrotum/Testes:        Left: Tenderness present. Mass or swelling not present.     Comments: Exam performed with RN chaperone present. Musculoskeletal:       Back:     Comments: There is tenderness to the right lower back just over the sacrum, and superior to the sacrum.  No midline spinal tenderness, no bony step-offs or gross deformities.  Skin:    General: Skin is warm.  Neurological:     Mental Status: He is alert.     Comments: Motor:  Normal tone. 5/5 in lower extremities bilaterally including strong and equal dorsiflexion/plantar flexion Sensory: Pinprick and light touch normal in BLE  extremities.  Deep Tendon Reflexes: 2+ and symmetric in the b/l patella Gait: normal gait and balance CV: distal pulses palpable throughout    Psychiatric:        Mood and Affect: Mood normal.        Behavior: Behavior normal.      ED Treatments / Results  Labs (all labs ordered are listed, but only abnormal results are displayed) Labs Reviewed  URINALYSIS, ROUTINE W REFLEX MICROSCOPIC - Abnormal; Notable for the following components:      Result Value   Hgb urine dipstick SMALL (*)    All other components within normal limits  GC/CHLAMYDIA PROBE AMP ([place]) NOT AT [facility]    EKG None  Radiology Ct Renal Stone Study  Result Date: 01/06/2018 CLINICAL DATA:  Left flank pain and LLQ pain x 1 week. No hematuria or urinary symptoms. EXAM: CT ABDOMEN AND PELVIS WITHOUT CONTRAST TECHNIQUE: Multidetector CT imaging of the abdomen and pelvis was performed following the standard protocol without IV contrast. COMPARISON:  None applicable; testicular ultrasound performed earlier today FINDINGS: Lower chest: There is minimal subsegmental atelectasis at the RIGHT lung base. Motion degraded images of the LOWER lungs. Heart size is normal. Hepatobiliary: Marked low-attenuation throughout the liver consistent with hepatic steatosis. The gallbladder is present. Pancreas: Unremarkable. No pancreatic ductal dilatation or surrounding inflammatory changes. Spleen: Normal in size without focal abnormality. Adrenals/Urinary Tract: Adrenal glands are normal in appearance. Kidneys are symmetric in size. No hydronephrosis. No intrarenal or ureteral stones. The bladder and visualized portion of the urethra are normal. Stomach/Bowel: Stomach and small bowel loops are normal in appearance. The appendix is well seen and has a normal appearance. Loops of colon are unremarkable. Vascular/Lymphatic: No significant vascular findings are present. No enlarged abdominal or pelvic lymph nodes. Reproductive: Prostate is  unremarkable. Other: No abdominal wall hernia or abnormality. No abdominopelvic ascites. Musculoskeletal: No acute or significant osseous findings. IMPRESSION: 1. No intrarenal or ureteral stones. 2. Normal appendix. 3. Hepatic steatosis. Electronically Signed   By: Norva Pavlov M.D.   On: 01/06/2018 14:13   US Scrotum W/doppler  Result Date: 01/06/2018 CLINICAL DATA:  Left testicular pain x3 days. EXAM: SCROTAL ULTRASOUND DOPPLER ULTRASOUND OF THE TESTICLES TECHNIQUE: Complete ultrasound examination of the testicles, epididymis, and other scrotal structures was performed. Color and spectral Doppler ultrasound were also utilized to evaluate blood flow to the testicles. COMPARISON:  None. FINDINGS: Right testicle Measurements: 4.9 x 2.4 x 3.3 cm. No mass or microlithiasis visualized. Left testicle Measurements: 4.7 x 2.4 x 2.9 cm. No mass or microlithiasis visualized. Right epididymis: No hyperemia. 0.9 x 0.7 cm cyst in the epididymal head. Left epididymis:  Normal in size and appearance. Hydrocele:  None visualized. Varicocele:  None visualized. Pulsed  Doppler interrogation of both testes demonstrates normal low resistance arterial and venous waveforms bilaterally. IMPRESSION: 1. No ultrasound evidence of testicular mass or torsion. 2. Small right epididymal cyst. Electronically Signed   By: Corlis Leak  Hassell M.D.   On: 01/06/2018 12:54    Procedures Procedures (including critical care time)  Medications Ordered in ED Medications - No data to display   Initial Impression / Assessment and Plan / ED Course  I have reviewed the triage vital signs and the nursing notes.  Pertinent labs & imaging results that were available during my care of the patient were reviewed by me and considered in my medical decision making (see chart for details).     Patient presenting with left lower back pain for multiple weeks, not improved with prescribed Flexeril and naproxen.  Recently developed left testicular pain  without swelling or redness.  No urinary symptoms.  On exam, back pain is reproducible, no CVA tenderness to percussion.  Normal neuro exam.  Abdomen is benign.  Testicular exam with tenderness, however no masses or swelling.  UA obtained, GC chlamydia sent.  Scrotal ultrasound.  UA with small hemoglobin, no signs of infection.  Scrotal ultrasound without abnormality to the left testicle.  Patient discussed with Dr. Anitra LauthPlunkett who recommends CT stone study given small hemoglobin in urine, not present when compared to prior UA in 2017.  Patient agreeable to this plan.  CT stone study ordered.  CT scan is unremarkable.  Discussed these results with patient and plan to treat for likely musculoskeletal pain.  Encouraged he continue muscle relaxers, NSAIDs, topical ice versus heat.  Provided Ortho referral for follow-up.  He is agreeable to plan and is safe for discharge.  Discussed results, findings, treatment and follow up. Patient advised of return precautions. Patient verbalized understanding and agreed with plan.   Final Clinical Impressions(s) / ED Diagnoses   Final diagnoses:  Acute left-sided low back pain without sciatica    ED Discharge Orders    None       , [place] N, PA-C 01/06/18 1522    Gwyneth SproutPlunkett, Whitney, MD 01/07/18 1309

## 2018-01-06 NOTE — Discharge Instructions (Addendum)
Please read instructions below.  You can take 600 mg of ibuprofen every 6 hours as needed for pain.   Apply ice to your back for 20 minutes at a time.  You can also apply heat if this provides more relief.   You can take Flexeril/cyclobenzaprine every 12 hours as needed for muscle spasm.  Be aware this medication can make you drowsy; do not take while driving or drinking alcohol.   Follow-up with the orthopedic specialist for persistent symptoms. Return to ER if new numbness or tingling in your arms or legs, inability to urinate, inability to hold your bowels, or weakness in your extremities.   Por favor, lea las instrucciones a continuacin. Puede tomar 600 mg de ibuprofeno cada 6 horas segn sea necesario para el dolor. Aplique hielo en su espalda por 20 minutos a la vez. Tambin puede aplicar calor si esto proporciona ms alivio. Puede tomar Flexeril / ciclobenzaprina cada 12 horas segn sea necesario para el espasmo muscular. Tenga en cuenta que este medicamento puede causar somnolencia; No tome mientras conduce o bebe alcohol. Seguimiento con el especialista ortopdico para sntomas persistentes. Regrese a la sala de emergencias si tiene nuevos entumecimientos u hormigueo en los brazos o las piernas, incapacidad para Geographical information systems officerorinar, incapacidad para retener los intestinos o debilidad en las extremidades.

## 2018-01-08 LAB — GC/CHLAMYDIA PROBE AMP (~~LOC~~) NOT AT ARMC
Chlamydia: NEGATIVE
NEISSERIA GONORRHEA: NEGATIVE

## 2019-05-04 ENCOUNTER — Ambulatory Visit (HOSPITAL_COMMUNITY)
Admission: EM | Admit: 2019-05-04 | Discharge: 2019-05-04 | Disposition: A | Discharge status: Home / Self Care | Payer: Self-pay | Attending: Urgent Care | Admitting: Urgent Care

## 2019-05-04 ENCOUNTER — Encounter (HOSPITAL_COMMUNITY): Payer: Self-pay

## 2019-05-04 ENCOUNTER — Other Ambulatory Visit: Payer: Self-pay

## 2019-05-04 DIAGNOSIS — M79675 Pain in left toe(s): Secondary | ICD-10-CM

## 2019-05-04 DIAGNOSIS — M109 Gout, unspecified: Secondary | ICD-10-CM

## 2019-05-04 HISTORY — DX: Gout, unspecified: M10.9

## 2019-05-04 MED ORDER — PREDNISONE 20 MG PO TABS: ORAL_TABLET | ORAL | 0 refills | Status: DC

## 2019-05-04 NOTE — ED Triage Notes (Signed)
Pt states he has gout in his 1st left toe started yesterday. Pt states he needs meds for the gout. Pt has 2+ edema of left 1st toe. Pt limped to exa room.

## 2019-05-04 NOTE — ED Provider Notes (Signed)
[place]   MRN: [id number] DOB: [birth year]  Subjective:   Darren Rodgers is a 39 y.o. male presenting for 1 day hx of acute onset recurrent left great toe pain and swelling. No trauma. Has hx of gout, last episode was 3 years ago. Used to drink heavily and eat very unhealthily. Changed routine/lifestyle and gout stopped but in the past 2 months has started previous drinking and unhealthy diet. Does not have a PCP. Has not had relief with otc medications.   No current facility-administered medications for this encounter.  Current Outpatient Medications:  .  cyclobenzaprine (FLEXERIL) 10 MG tablet, Take 1 tablet (10 mg total) by mouth 2 (two) times daily as needed for muscle spasms., Disp: 20 tablet, Rfl: 0 .  naproxen (NAPROSYN) 500 MG tablet, Take 1 tablet (500 mg total) by mouth 2 (two) times daily., Disp: 20 tablet, Rfl: 0   No Known Allergies  Past Medical History:  Diagnosis Date  . Gout      Past Surgical History:  Procedure Laterality Date  . FOREARM FRACTURE SURGERY Left     No family history on file.  Social History   Tobacco Use  . Smoking status: Never Smoker  . Smokeless tobacco: Never Used  Substance Use Topics  . Alcohol use: Yes    Alcohol/week: 10.0 standard drinks    Types: 10 drink(s) per week  . Drug use: No    ROS   Objective:   Vitals: BP 116/80   Pulse 88   Temp 98.4 F (36.9 C) (Oral)   Resp 16   Ht 5\' 4"  (1.626 m)   Wt 235 lb (106.6 kg)   SpO2 96%   BMI 40.34 kg/m   Physical Exam Constitutional:      General: He is not in acute distress.    Appearance: Normal appearance. He is well-developed and normal weight. He is not ill-appearing, toxic-appearing or diaphoretic.  HENT:     Head: Normocephalic and atraumatic.     Right Ear: External ear normal.     Left Ear: External ear normal.     Nose: Nose normal.     Mouth/Throat:     Pharynx: Oropharynx is clear.  Eyes:     General: No scleral icterus.       Right eye:  No discharge.        Left eye: No discharge.     Extraocular Movements: Extraocular movements intact.     Pupils: Pupils are equal, round, and reactive to light.  Cardiovascular:     Rate and Rhythm: Normal rate.  Pulmonary:     Effort: Pulmonary effort is normal.  Musculoskeletal:     Cervical back: Normal range of motion.     Left foot: Decreased range of motion. Normal capillary refill. Swelling (with warmth at 1st MTP), tenderness (1st MTP) and bony tenderness (1st MTP) present. No crepitus.  Neurological:     Mental Status: He is alert and oriented to person, place, and time.  Psychiatric:        Mood and Affect: Mood normal.        Behavior: Behavior normal.        Thought Content: Thought content normal.        Judgment: Judgment normal.      Assessment and Plan :   PDMP not reviewed this encounter.  1. Great toe pain, left   2. Acute gout involving toe of left foot, unspecified cause     Counseled patient on  maintaining healthy lifestyle. Start prednisone to help with recurrent gout attack. Establish care with new PCP, info provided to the patient for this. Counseled patient on potential for adverse effects with medications prescribed/recommended today, ER and return-to-clinic precautions discussed, patient verbalized understanding.    Wallis Bamberg, PA-C 05/04/19 1326

## 2019-05-09 ENCOUNTER — Ambulatory Visit (HOSPITAL_COMMUNITY)
Admission: EM | Admit: 2019-05-09 | Discharge: 2019-05-09 | Disposition: A | Discharge status: Home / Self Care | Payer: Self-pay | Attending: Physician Assistant | Admitting: Physician Assistant

## 2019-05-09 ENCOUNTER — Encounter (HOSPITAL_COMMUNITY): Payer: Self-pay

## 2019-05-09 ENCOUNTER — Other Ambulatory Visit: Payer: Self-pay

## 2019-05-09 DIAGNOSIS — M109 Gout, unspecified: Secondary | ICD-10-CM

## 2019-05-09 MED ORDER — COLCHICINE 0.6 MG PO TABS: 0.6000 mg | ORAL_TABLET | Freq: Every day | ORAL | 0 refills | Status: DC

## 2019-05-09 MED ORDER — INDOMETHACIN 50 MG PO CAPS: 50.0000 mg | ORAL_CAPSULE | Freq: Three times a day (TID) | ORAL | 0 refills | Status: AC | PRN

## 2019-05-09 NOTE — Discharge Instructions (Addendum)
Take the indocin 3 times a day for 5 days Take the colchicine, 2 tablets and then 1 tablet 1 hour later  Continue health life style measures to prevent flares.  Establish with a Primary care for further management.    tome la indocina 3 veces al da durante 5 [place]. Tome la colchicina, 2 tabletas y luego 1 tableta 1 hora despus  Contine con las medidas de estilo de vida saludable para prevenir los brotes.  Establecer con un Atencin Primaria para su posterior manejo.  Regrese si no mejora despus de [employer]  Return if not improved following this treatment

## 2019-05-09 NOTE — ED Triage Notes (Signed)
Pt states he has gout. Pt states he was here on Saturday. Pt states the meds did not work. ( left foot big toe )

## 2019-05-09 NOTE — ED Provider Notes (Signed)
[place]    CSN: [id number] Arrival date & time: 05/09/19  1811      History   Chief Complaint Chief Complaint  Patient presents with  . Gout    HPI Darren Rodgers is a 39 y.o. male.   Patient presents for reevaluation of left toe pain.  He was seen on 05/04/2019 in this urgent care for gout flare.  He was placed on prednisone 40 mg for 5 days.  He reports completing this yesterday he had some improvement however he woke this morning with significant return of pain.  He reports this has happened before with gout flares requiring more than one treatment.  He denies any fevers or chills.  He reports this does feel like the same pain initially he had with his gout flare. He denies development of any fevers or chills.   Spanish interpreter was utilized for the duration of the exam.     Past Medical History:  Diagnosis Date  . Gout     There are no problems to display for this patient.   Past Surgical History:  Procedure Laterality Date  . FOREARM FRACTURE SURGERY Left        Home Medications    Prior to Admission medications   Medication Sig Start Date End Date Taking? Authorizing Provider  colchicine 0.6 MG tablet Take 1 tablet (0.6 mg total) by mouth daily. Take 2 tablets initially and then 1 tablet 1 hour later 05/09/19   Doreen Garretson, Marguerita Beards, PA-C  indomethacin (INDOCIN) 50 MG capsule Take 1 capsule (50 mg total) by mouth 3 (three) times daily as needed for up to 5 days for mild pain or moderate pain. 05/09/19 05/14/19  Filippo Puls, Marguerita Beards, PA-C  predniSONE (DELTASONE) 20 MG tablet Take 2 tablets daily with breakfast. 05/04/19   Jaynee Eagles, PA-C    Family History History reviewed. No pertinent family history.  Social History Social History   Tobacco Use  . Smoking status: Never Smoker  . Smokeless tobacco: Never Used  Substance Use Topics  . Alcohol use: Yes    Alcohol/week: 10.0 standard drinks    Types: 10 drink(s) per week  . Drug use: No      Allergies   Patient has no known allergies.   Review of Systems Review of Systems   Physical Exam Triage Vital Signs ED Triage Vitals  Enc Vitals Group     BP 05/09/19 1830 113/73     Pulse Rate 05/09/19 1830 73     Resp 05/09/19 1830 18     Temp 05/09/19 1830 98.4 F (36.9 C)     Temp Source 05/09/19 1830 Oral     SpO2 05/09/19 1830 99 %     Weight --      Height --      Head Circumference --      Peak Flow --      Pain Score 05/09/19 1828 9     Pain Loc --      Pain Edu? --      Excl. in [place]? --    No data found.  Updated Vital Signs BP 113/73   Pulse 73   Temp 98.4 F (36.9 C) (Oral)   Resp 18   SpO2 99%   Visual Acuity Right Eye Distance:   Left Eye Distance:   Bilateral Distance:    Right Eye Near:   Left Eye Near:    Bilateral Near:     Physical Exam Vitals and nursing  note reviewed.  Constitutional:      General: He is not in acute distress.    Appearance: Normal appearance. He is not ill-appearing.  Cardiovascular:     Rate and Rhythm: Normal rate.     Pulses: Normal pulses.  Pulmonary:     Effort: Pulmonary effort is normal. No respiratory distress.  Musculoskeletal:     Right lower leg: No edema.     Left lower leg: No edema.     Comments: Left great toe with mild swelling. Exquisite tenderness palpation over the joints of the great toe. Minimal to no erythema at this time. Patient walking with a limp. No signs of lymphangitis or spreading erythema. Cap refill less than 2 seconds.  Neurological:     Mental Status: He is alert.      UC Treatments / Results  Labs (all labs ordered are listed, but only abnormal results are displayed) Labs Reviewed - No data to display  EKG   Radiology No results found.  Procedures Procedures (including critical care time)  Medications Ordered in UC Medications - No data to display  Initial Impression / Assessment and Plan / UC Course  I have reviewed the triage vital signs and the  nursing notes.  Pertinent labs & imaging results that were available during my care of the patient were reviewed by me and considered in my medical decision making (see chart for details).     #Gout Patient is a 39 year old with past medical history of gout presenting for continued issues with his left great toe. Patient just completed a course of prednisone and has had recurrence of pain. Reviewed the chart from previous visit. We will start on indomethacin and colchicine today. Patient to return for consideration of other etiologies if pain does not improve significantly end of this treatment. Patient to establish with a primary care. Extensive discussion and education on proper way to take the medicines he was prescribed today was had. Patient verbalizes full understanding the plan. Final Clinical Impressions(s) / UC Diagnoses   Final diagnoses:  Acute gout involving toe of left foot, unspecified cause     Discharge Instructions     Take the indocin 3 times a day for 5 days Take the colchicine, 2 tablets and then 1 tablet 1 hour later  Continue health life style measures to prevent flares.  Establish with a Primary care for further management.    tome la indocina 3 veces al da durante 5 Iola. Tome la colchicina, 2 tabletas y luego 1 tableta 1 hora despus  Contine con las medidas de estilo de vida saludable para prevenir los brotes.  Establecer con un Atencin Primaria para su posterior manejo.  Regrese si no mejora despus de [employer]  Return if not improved following this treatment      ED Prescriptions    Medication Sig Dispense Auth. Provider   indomethacin (INDOCIN) 50 MG capsule Take 1 capsule (50 mg total) by mouth 3 (three) times daily as needed for up to 5 days for mild pain or moderate pain. 15 capsule Jyssica Rief, Veryl Speak, PA-C   colchicine 0.6 MG tablet Take 1 tablet (0.6 mg total) by mouth daily. Take 2 tablets initially and then 1 tablet 1 hour later 3  tablet Kadan Millstein, Veryl Speak, PA-C     PDMP not reviewed this encounter.   Hermelinda Medicus, PA-C 05/09/19 2112

## 2019-05-24 ENCOUNTER — Encounter (HOSPITAL_COMMUNITY): Payer: Self-pay

## 2019-05-24 ENCOUNTER — Ambulatory Visit (INDEPENDENT_AMBULATORY_CARE_PROVIDER_SITE_OTHER): Payer: Self-pay

## 2019-05-24 ENCOUNTER — Ambulatory Visit (HOSPITAL_COMMUNITY)
Admission: EM | Admit: 2019-05-24 | Discharge: 2019-05-24 | Disposition: A | Discharge status: Home / Self Care | Payer: Self-pay | Attending: Emergency Medicine | Admitting: Emergency Medicine

## 2019-05-24 ENCOUNTER — Other Ambulatory Visit: Payer: Self-pay

## 2019-05-24 DIAGNOSIS — Z8739 Personal history of other diseases of the musculoskeletal system and connective tissue: Secondary | ICD-10-CM | POA: Insufficient documentation

## 2019-05-24 DIAGNOSIS — M79675 Pain in left toe(s): Secondary | ICD-10-CM

## 2019-05-24 DIAGNOSIS — M7989 Other specified soft tissue disorders: Secondary | ICD-10-CM | POA: Insufficient documentation

## 2019-05-24 LAB — BASIC METABOLIC PANEL
Anion gap: 10 (ref 5–15)
BUN: 13 mg/dL (ref 6–20)
CO2: 25 mmol/L (ref 22–32)
Calcium: 9.5 mg/dL (ref 8.9–10.3)
Chloride: 106 mmol/L (ref 98–111)
Creatinine, Ser: 1.03 mg/dL (ref 0.61–1.24)
GFR calc Af Amer: >60 mL/min (ref >60)
GFR calc non Af Amer: >60 mL/min (ref >60)
Glucose, Bld: 123 mg/dL — ABNORMAL HIGH (ref 70–99)
Potassium: 4 mmol/L (ref 3.5–5.1)
Sodium: 141 mmol/L (ref 135–145)

## 2019-05-24 LAB — URIC ACID: Uric Acid, Serum: 9.9 mg/dL — ABNORMAL HIGH (ref 3.7–8.6)

## 2019-05-24 MED ORDER — COLCHICINE 0.6 MG PO TABS: 0.6000 mg | ORAL_TABLET | Freq: Two times a day (BID) | ORAL | 0 refills | Status: DC

## 2019-05-24 NOTE — ED Provider Notes (Signed)
[place]   MRN: [id number] DOB: 1980-07-30  Subjective:   Darren Rodgers is a 39 y.o. male presenting for 1 day history of recurrent left great toe pain and swelling.  This is the patient's third visit for the same recurrent symptoms in his many weeks.  He initially underwent a course of prednisone which helped his symptoms but once he was finished with the prednisone symptoms return.  This prompted a second visit, patient underwent a course of indomethacin and colchicine which helped but now that he is out of these medications his symptoms returned yesterday.  Patient has made some dietary modifications but still ate some shrimp over the past couple of weeks, stop drinking alcohol as previously discussed.  Denies falls, trauma, redness, bruising.  No current facility-administered medications for this encounter.  Current Outpatient Medications:  .  colchicine 0.6 MG tablet, Take 1 tablet (0.6 mg total) by mouth daily. Take 2 tablets initially and then 1 tablet 1 hour later, Disp: 3 tablet, Rfl: 0 .  predniSONE (DELTASONE) 20 MG tablet, Take 2 tablets daily with breakfast., Disp: 10 tablet, Rfl: 0   No Known Allergies  Past Medical History:  Diagnosis Date  . Gout      Past Surgical History:  Procedure Laterality Date  . FOREARM FRACTURE SURGERY Left     History reviewed. No pertinent family history.  Social History   Tobacco Use  . Smoking status: Never Smoker  . Smokeless tobacco: Never Used  Substance Use Topics  . Alcohol use: Yes    Alcohol/week: 10.0 standard drinks    Types: 10 drink(s) per week  . Drug use: No    ROS   Objective:   Vitals: BP 117/78 (BP Location: Right Arm)   Pulse 81   Temp 98.5 F (36.9 C) (Oral)   Resp 18   SpO2 96%   Physical Exam Constitutional:      General: He is not in acute distress.    Appearance: Normal appearance. He is well-developed and normal weight. He is not ill-appearing, toxic-appearing or diaphoretic.    HENT:     Head: Normocephalic and atraumatic.     Right Ear: External ear normal.     Left Ear: External ear normal.     Nose: Nose normal.     Mouth/Throat:     Pharynx: Oropharynx is clear.  Eyes:     General: No scleral icterus.       Right eye: No discharge.        Left eye: No discharge.     Extraocular Movements: Extraocular movements intact.     Pupils: Pupils are equal, round, and reactive to light.  Cardiovascular:     Rate and Rhythm: Normal rate.  Pulmonary:     Effort: Pulmonary effort is normal.  Musculoskeletal:     Cervical back: Normal range of motion.       Legs:  Skin:    General: Skin is warm and dry.  Neurological:     Mental Status: He is alert and oriented to person, place, and time.  Psychiatric:        Mood and Affect: Mood normal.        Behavior: Behavior normal.        Thought Content: Thought content normal.        Judgment: Judgment normal.     DG Toe Great Left  Result Date: 05/24/2019 CLINICAL DATA:  Left great toe pain and swelling for the past 2 weeks.  No injury. EXAM: LEFT GREAT TOE COMPARISON:  None. FINDINGS: There is no evidence of fracture or dislocation. There is no evidence of arthropathy or other focal bone abnormality. Soft tissues are unremarkable. IMPRESSION: Negative. Electronically Signed   By: Obie Dredge M.D.   On: 05/24/2019 12:20    Assessment and Plan :   PDMP not reviewed this encounter.  1. Great toe pain, left   2. Pain and swelling of toe of left foot   3. History of gout     Emphasized need to be consistent and avoid gout causing foods.  Patient is to start colchicine twice daily until he can be seen by new PCP.  Labs pending. Counseled patient on potential for adverse effects with medications prescribed/recommended today, ER and return-to-clinic precautions discussed, patient verbalized understanding.    Wallis Bamberg, PA-C 05/24/19 1242

## 2019-05-24 NOTE — ED Triage Notes (Addendum)
Pt reports pain and swelling in great big toe and left feet x 1 day. Pt reports he was treated for gout flare  with indomethacin and colchicine 3 weeks ago approx and all the symptoms improved. Pt started running and playing basketball to loose weight 2 weeks ago. Pt states he may have the flare up as he ate some shrimp 3 days ago.

## 2019-09-20 ENCOUNTER — Encounter (HOSPITAL_COMMUNITY): Payer: Self-pay

## 2019-09-20 ENCOUNTER — Other Ambulatory Visit: Payer: Self-pay

## 2019-09-20 ENCOUNTER — Ambulatory Visit (HOSPITAL_COMMUNITY)
Admission: EM | Admit: 2019-09-20 | Discharge: 2019-09-20 | Disposition: A | Discharge status: Home / Self Care | Payer: Self-pay | Attending: Urgent Care | Admitting: Urgent Care

## 2019-09-20 DIAGNOSIS — M109 Gout, unspecified: Secondary | ICD-10-CM

## 2019-09-20 DIAGNOSIS — M25562 Pain in left knee: Secondary | ICD-10-CM

## 2019-09-20 MED ORDER — PREDNISONE 20 MG PO TABS: ORAL_TABLET | ORAL | 0 refills | Status: DC

## 2019-09-20 NOTE — ED Provider Notes (Signed)
[facility]   MRN: [id number] DOB: 27-Jul-1980  Subjective:   Rajah Tagliaferro is a 39 y.o. male presenting for acute onset last night of left knee pain and swelling, hot sensation.  Patient has a history of persistent gout.  Admits that he is not been eating the foods that he supposed to avoid.  We had extensive discussion about this last time and he did well prednisone and colchicine.  States that he does not want to use colchicine again even though it helped as he took it long-term but was very expensive for him.  He denies fall, trauma, weakness.  He does work in [employer] but states that he has been doing light work this week.  He is limiting his alcohol as we previously discussed.  No current facility-administered medications for this encounter.  Current Outpatient Medications:  .  colchicine 0.6 MG tablet, Take 1 tablet (0.6 mg total) by mouth 2 (two) times daily., Disp: 60 tablet, Rfl: 0 .  predniSONE (DELTASONE) 20 MG tablet, Take 2 tablets daily with breakfast., Disp: 10 tablet, Rfl: 0   No Known Allergies  Past Medical History:  Diagnosis Date  . Gout      Past Surgical History:  Procedure Laterality Date  . FOREARM FRACTURE SURGERY Left     No family history on file.  Social History   Tobacco Use  . Smoking status: Never Smoker  . Smokeless tobacco: Never Used  Substance Use Topics  . Alcohol use: Yes    Alcohol/week: 10.0 standard drinks    Types: 10 drink(s) per week  . Drug use: No    ROS   Objective:   Vitals: BP 117/84   Pulse 76   Temp 97.8 F (36.6 C) (Oral)   Resp 16   Ht 5\' 7"  (1.702 m)   Wt 240 lb (108.9 kg)   SpO2 97%   BMI 37.59 kg/m   Physical Exam Constitutional:      General: He is not in acute distress.    Appearance: Normal appearance. He is well-developed and normal weight. He is not ill-appearing, toxic-appearing or diaphoretic.  HENT:     Head: Normocephalic and atraumatic.     Right Ear: External ear  normal.     Left Ear: External ear normal.     Nose: Nose normal.     Mouth/Throat:     Pharynx: Oropharynx is clear.  Eyes:     General: No scleral icterus.       Right eye: No discharge.        Left eye: No discharge.     Extraocular Movements: Extraocular movements intact.     Pupils: Pupils are equal, round, and reactive to light.  Cardiovascular:     Rate and Rhythm: Normal rate.  Pulmonary:     Effort: Pulmonary effort is normal.  Musculoskeletal:     Cervical back: Normal range of motion.     Left knee: Swelling and erythema present. No deformity, effusion, ecchymosis, lacerations, bony tenderness or crepitus. Decreased range of motion. Tenderness (over areas outlined) present. No patellar tendon tenderness. Normal alignment and normal patellar mobility.       Legs:  Skin:    General: Skin is warm and dry.  Neurological:     Mental Status: He is alert and oriented to person, place, and time.  Psychiatric:        Mood and Affect: Mood normal.        Behavior: Behavior normal.  Thought Content: Thought content normal.        Judgment: Judgment normal.     Assessment and Plan :   PDMP not reviewed this encounter.  1. Acute pain of left knee   2. Acute gout of left knee, unspecified cause     Patient prefers not to do colchicine again long term. Start prednisone x5 days. Emphasized need to avoid gout causing foods.  Continue efforts at cutting back on alcohol use. Counseled patient on potential for adverse effects with medications prescribed/recommended today, ER and return-to-clinic precautions discussed, patient verbalized understanding.    Wallis Bamberg, PA-C 09/20/19 1112

## 2019-09-20 NOTE — ED Triage Notes (Signed)
Pt c/o 8/10 throbbing pain in left knee. Pt denies injury. Pt states he thinks it's gout. Pt has 1+ swelling of left knee. Pt limped to exam room.

## 2019-12-07 ENCOUNTER — Other Ambulatory Visit: Payer: Self-pay

## 2019-12-07 ENCOUNTER — Encounter (HOSPITAL_COMMUNITY): Payer: Self-pay

## 2019-12-07 ENCOUNTER — Ambulatory Visit (HOSPITAL_COMMUNITY)
Admission: EM | Admit: 2019-12-07 | Discharge: 2019-12-07 | Disposition: A | Discharge status: Home / Self Care | Payer: Self-pay | Attending: Family Medicine | Admitting: Family Medicine

## 2019-12-07 DIAGNOSIS — M25561 Pain in right knee: Secondary | ICD-10-CM

## 2019-12-07 DIAGNOSIS — S8391XA Sprain of unspecified site of right knee, initial encounter: Secondary | ICD-10-CM

## 2019-12-07 MED ORDER — PREDNISONE 20 MG PO TABS: 20.0000 mg | ORAL_TABLET | Freq: Every day | ORAL | 0 refills | Status: AC

## 2019-12-07 MED ORDER — IBUPROFEN 800 MG PO TABS: 800.0000 mg | ORAL_TABLET | Freq: Three times a day (TID) | ORAL | 0 refills | Status: DC | PRN

## 2019-12-07 NOTE — Discharge Instructions (Addendum)
Wear a brace when you are working to help support your knee.   Prednisone once daily in the morning for 5 days  Ibuprofen every 8 hours as needed  Follow up with sports medicine as needed

## 2019-12-07 NOTE — ED Triage Notes (Signed)
Pt presents with right knee pain X 3 days, non injury related; pt states he works in [employer] and is on & off ladders and stuff all day.

## 2019-12-07 NOTE — ED Provider Notes (Signed)
[facility]   [id number] 12/07/19 Arrival Time: 1251  QB:HALPF PAIN  SUBJECTIVE: History from: patient. Darren Rodgers is a 39 y.o. male complains of right knee pain for the last 2 months intermittently with gout. 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 Denies specific injury. Denies this type of pain in the past. Describes the pain as constant and achy in character. Has not attempted OTC treatment. Symptoms are made worse with activity. Denies similar symptoms in the past. Denies fever, chills, erythema, ecchymosis, effusion, weakness, numbness and tingling, saddle paresthesias, loss of bowel or bladder function.      ROS: As per HPI.  All other pertinent ROS negative.     Past Medical History:  Diagnosis Date  . Gout    Past Surgical History:  Procedure Laterality Date  . FOREARM FRACTURE SURGERY Left    No Known Allergies No current facility-administered medications on file prior to encounter.   Current Outpatient Medications on File Prior to Encounter  Medication Sig Dispense Refill  . colchicine 0.6 MG tablet Take 1 tablet (0.6 mg total) by mouth 2 (two) times daily. 60 tablet 0   Social History   Socioeconomic History  . Marital status: Single    Spouse name: n/a  . Number of children: 0  . Years of education: 8th grade  . Highest education level: Not on file  Occupational History  . Occupation: [employer] representative  Tobacco Use  . Smoking status: Never Smoker  . Smokeless tobacco: Never Used  Substance and Sexual Activity  . Alcohol use: Yes    Alcohol/week: 10.0 standard drinks    Types: 10 drink(s) per week  . Drug use: No  . Sexual activity: Not on file  Other Topics Concern  . Not on file  Social History Narrative   From [place] (near [place]). Came to the [place] in 2005. Lives with a friend.   Social Determinants of Health   Financial Resource Strain:   . Difficulty of Paying Living Expenses:  Not on file  Food Insecurity:   . Worried About Programme researcher, broadcasting/film/video in the Last Year: Not on file  . Ran Out of Food in the Last Year: Not on file  Transportation Needs:   . Lack of Transportation (Medical): Not on file  . Lack of Transportation (Non-Medical): Not on file  Physical Activity:   . Days of Exercise per Week: Not on file  . Minutes of Exercise per Session: Not on file  Stress:   . Feeling of Stress : Not on file  Social Connections:   . Frequency of Communication with Friends and Family: Not on file  . Frequency of Social Gatherings with Friends and Family: Not on file  . Attends Religious Services: Not on file  . Active Member of Clubs or Organizations: Not on file  . Attends Banker Meetings: Not on file  . Marital Status: Not on file  Intimate Partner Violence:   . Fear of Current or Ex-Partner: Not on file  . Emotionally Abused: Not on file  . Physically Abused: Not on file  . Sexually Abused: Not on file   History reviewed. No pertinent family history.  OBJECTIVE:  Vitals:   12/07/19 1441  BP: 129/90  Pulse: 74  Resp: 18  Temp: 98.2 F (36.8 C)  TempSrc: Oral  SpO2: 99%    General appearance: ALERT; in no acute distress.  Head: NCAT Lungs:  Normal respiratory effort CV: pulses 2+ bilaterally. Cap refill < 2 seconds Musculoskeletal:  Inspection: Skin warm, dry, clear and intact without obvious erythema, effusion, or ecchymosis.  Palpation: superior, medial aspect of R knee tender to palpation, no erythema or heat to the area ROM: limited ROM active and passive to R knee Skin: warm and dry Neurologic: Ambulates without difficulty; Sensation intact about the upper/ lower extremities Psychological: alert and cooperative; normal mood and affect  DIAGNOSTIC STUDIES:  No results found.   ASSESSMENT & PLAN:  1. Sprain of right knee, unspecified ligament, initial encounter   2. Acute pain of right knee      Meds ordered this encounter   Medications  . ibuprofen (ADVIL) 800 MG tablet    Sig: Take 1 tablet (800 mg total) by mouth every 8 (eight) hours as needed for moderate pain.    Dispense:  21 tablet    Refill:  0    Order Specific Question:   Supervising Provider    Answer:   Merrilee Jansky [id number]  . predniSONE (DELTASONE) 20 MG tablet    Sig: Take 1 tablet (20 mg total) by mouth daily with breakfast for 5 days.    Dispense:  5 tablet    Refill:  0    Order Specific Question:   Supervising Provider    Answer:   Merrilee Jansky [[id number]]   Prescribed ibuprofen Prescribed prednisone Sprain vs meniscus injury Continue conservative management of rest, ice, and gentle stretches Take ibuprofen as needed for pain relief (may cause abdominal discomfort, ulcers, and GI bleeds avoid taking with other NSAIDs)  Follow up with sports medicine if symptoms persist Return or go to the ER if you have any new or worsening symptoms (fever, chills, chest pain, abdominal pain, changes in bowel or bladder habits, pain radiating into lower legs)    Reviewed expectations re: course of current medical issues. Questions answered. Outlined signs and symptoms indicating need for more acute intervention. Patient verbalized understanding. After Visit Summary given.       Moshe Cipro, NP 12/07/19 [phone]

## 2019-12-13 ENCOUNTER — Other Ambulatory Visit: Payer: Self-pay

## 2019-12-13 ENCOUNTER — Ambulatory Visit (INDEPENDENT_AMBULATORY_CARE_PROVIDER_SITE_OTHER): Payer: Self-pay | Admitting: Family Medicine

## 2019-12-13 VITALS — BP 115/75 | Ht 67.0 in | Wt 210.0 lb

## 2019-12-13 DIAGNOSIS — M25561 Pain in right knee: Secondary | ICD-10-CM

## 2019-12-13 MED ORDER — METHYLPREDNISOLONE ACETATE 80 MG/ML IJ SUSP
80.0000 mg | Freq: Once | INTRAMUSCULAR | Status: AC
  Administered 2019-12-13: 80 mg via INTRA_ARTICULAR

## 2019-12-13 NOTE — Assessment & Plan Note (Addendum)
Patient with acute right knee pain with no known mechanism of injury.  Could be either early onset osteoarthritis given the nature of his job and his exam could certainly can be consistent with this.  Could also be degenerative meniscal disease as well.  Given age and symptoms will treat conservatively. -Injection of corticosteroid given into the right knee notated below -Patient instructed to take it easy and can take ibuprofen as needed. -Follow-up in 4 weeks if no improvement; if he has not improved when he follows up I would recommend at least getting x-rays and consider repeating ultrasound.

## 2019-12-13 NOTE — Patient Instructions (Signed)
Patient declined  

## 2019-12-13 NOTE — Progress Notes (Addendum)
SUBJECTIVE:   CHIEF COMPLAINT / HPI:   Right knee pain Mr. Kupper is a pleasant 39 year old male who is present and has a in person interpreter with him today presenting as new patient to this practice for right knee pain ongoing for the past 8 days.  He states he has had pain like this in the past though was many years ago and he went saw Dr. And got an injection which alleviated his symptoms.  He cannot remember any inciting event 8 days ago which he can think that would have triggered his pain but he denies any fall trauma or major injury to the right knee in any recent time.  He does state that he has had gout the last episode was about 4 months ago but this feels very different.  He works in [employer] and is very active and goes up and down stairs a lot.  He states that activity and standing on his feet for long periods of time causes increased pain and he has noticed in the morning it is harder to move it and it does get stiff if he sits and then tries to again be more active and move around.  It is located more along the medial side and is nonradiating.  He went to urgent care 8 days ago and did get medication but states it is not helping much.  He denies any skin changes, warmth, erythema.  He has not noticed any swelling.  PERTINENT  PMH / PSH: Reported history of gout  OBJECTIVE:   BP 115/75   Ht 5\' 7"  (1.702 m)   Wt 210 lb (95.3 kg)   BMI 32.89 kg/m   No flowsheet data found.  Knee, right: Inspection was negative for erythema, ecchymosis, and effusion. No obvious bony abnormalities or signs of osteophyte development. Palpation yielded no asymmetric warmth; Positive for medial joint line tenderness and tenderness along the medial border of the patella; no other tenderness. No patellar crepitus. Patellar and quadriceps tendons unremarkable, and no tenderness of the pes anserine bursa. ROM normal in flexion (135 degrees) and extension (0 degrees). Normal hamstring and quadriceps  strength. Neurovascularly intact bilaterally. Special Tests  - Cruciate Ligaments:   - Anterior Drawer:  NEG - Posterior Drawer: NEG   - Lachman:  NEG   - Lever: NEG  - Collateral Ligaments:   - Varus/Valgus Stress test: NEG  - Meniscus:   - Thessaly: Equivical   - McMurray's: Equivical  - Patella:   - Patellar grind/compression: NEG   - Patellar glide: Without apprehension   ASSESSMENT/PLAN:   Right knee pain Patient with acute right knee pain with no known mechanism of injury.  Could be either early onset osteoarthritis given the nature of his job and his exam could certainly can be consistent with this.  Could also be degenerative meniscal disease as well.  Given age and symptoms will treat conservatively. -Injection of corticosteroid given into the right knee notated below -Patient instructed to take it easy and can take ibuprofen as needed. -Follow-up in 4 weeks if no improvement; if he has not improved when he follows up I would recommend at least getting x-rays and consider repeating ultrasound.  Procedure Verbal consent was obtained after discussing the risks and benefits of the procedure with the patient. A timeout was performed and the correct site and side was identified and confirmed.  The left knee was cleaned in sterile fashion betadine and alcohol pad. 2cc 40 mg Depo-medrol and 4  cc 1% Lidocaine was injected using a anterior medial approach using a 5 cc syringe and 25 gauge 1 and 1/2 in needle. No complications were encountered. Minimal blood loss. A band aid was applied.    Arlyce Harman, DO PGY-4, Sports Medicine Fellow [facility]  I was the preceptor for this visit and available for immediate consultation Marsa Aris, DO

## 2019-12-20 ENCOUNTER — Ambulatory Visit (INDEPENDENT_AMBULATORY_CARE_PROVIDER_SITE_OTHER): Payer: Self-pay | Admitting: Family Medicine

## 2019-12-20 ENCOUNTER — Other Ambulatory Visit: Payer: Self-pay

## 2019-12-20 ENCOUNTER — Ambulatory Visit: Payer: Self-pay

## 2019-12-20 VITALS — BP 110/73 | Ht 67.0 in | Wt 205.0 lb

## 2019-12-20 DIAGNOSIS — M25561 Pain in right knee: Secondary | ICD-10-CM

## 2019-12-20 NOTE — Progress Notes (Signed)
[facility] Attending Note: Interpreter used for entire visit I have seen and examined this patient. I have discussed this patient with the resident and reviewed the assessment and plan as documented above. I agree with the resident's findings and plan. RIGHT knee pain that did not improve with corticosteroid injection.  Ultrasound is concerning for meniscal tear and certainly that corresponds with his symptoms.  Using interpreter, we discussed how to rest the knee and still function at work.  I want him to avoid squatting climbing ladder or essentially anything that causes the knee to hurt.  I had like to see him back in a week.  If were not making any progress, I would consider doing a second steroid injection.  We are limited somewhat by his lack of insurance so we cannot get an MRI.  Ultrasound knee right: Suprapatellar space is without any excess fluid.  The quadricep and patellar tendons are intact.  The medial meniscus appears intact.  The lateral meniscus appears to have a defect that travels into the surface of the ultrasound image concerning for meniscal tear.  Popliteal space is benign.

## 2019-12-20 NOTE — Assessment & Plan Note (Addendum)
No relief after previous steroid injection last week. Pain progressively worsening. Localized to the medial joint line. Most likely diagnosis medial meniscus injury. Patient is self-pay and cannot afford x-rays or physical therapy. Pt advised to avoid heavy lifting, climbing, or bending of the knees while at work. Pt advised to continue naproxen and tylenol.  Advised he can use topical treatments as well and he should ice the knee as much as possible.  Will follow up in one week and reassess.

## 2019-12-20 NOTE — Patient Instructions (Signed)
It was nice to meet you today,  It appears you have a meniscal injury to your right knee.  We have made the following recommendations today: -Limit your heavy lifting as much as possible while at work.  Also limit climbing or bending down or squatting as much as possible. -Continue to take the naproxen and Tylenol as needed.  You can take 1000 mg of Tylenol 3 times a day.  You can take the naproxen twice a day. -Ice your knee as much as possible.  The more you ice it the better. -I would like to see you back next Friday so we can reevaluate you and possibly give you another knee injection if needed.  If your pain gets much worse before next Friday, please see [place] sooner.  Have a great day,  Frederic Jericho, MD  Fue un placer conocerte hoy,  Parece que tiene una lesin de menisco en la rodilla derecha. Hoy hemos hecho las siguientes recomendaciones: -Limite su trabajo pesado tanto como sea posible mientras est en el [place]. Tambin limite tanto como sea posible trepar, agacharse o ponerse en cuclillas. -Contine tomando naproxeno y Tylenol segn sea necesario. Puede tomar 1000 mg de Tylenol 3 veces al da. Puede tomar el [employer] al da. -Hielo en la rodilla tanto como sea posible. Cuanto ms lo hiele, mejor. -Me gustara volver a verte el prximo viernes para que podamos reevaluarlo y posiblemente darte otra inyeccin en la rodilla si es necesario.  Si su dolor empeora mucho antes del prximo viernes, vistenos antes.  Qu tengas un lindo da,  Frederic Jericho, MD

## 2019-12-20 NOTE — Progress Notes (Signed)
    SUBJECTIVE:   CHIEF COMPLAINT / HPI:   R knee pain: he did not receive any pain after the injection.  He has never had an injection on that knee before.  He had an injection on the left knee ten years ago. He works [employer] and has been working since last time.  He is on his knees a lot and wears knee pads. The pain is a 9/10 now.  He has been taking naproxen twice a day.  He also takes tylenol and rubs something on it but doesn't know the name.  He thinks it is icy hot. His knee does not buckle.  It is worse with activity.  He does not put ice on it.    PERTINENT  PMH / PSH: none  OBJECTIVE:   BP 110/73   Ht 5\' 7"  (1.702 m)   Wt 205 lb (93 kg)   BMI 32.11 kg/m   General: Alert and oriented. Interpreter is present. MSK: antalgic gait. Right and left knee are grossly symmetrical. No swelling appreciated. Patient is tender to palpation on the medial joint line of the right knee. Medial knee tenderness with seated internal hip rotation, but not external. No knee crepitus. Normal range of motion. Thessaly test positive for medial knee pain on the right. FABER test negative. No tenderness to palpation of the hips or back. Skin: Patient has thickening and hyperpigmentation of the skin anterior to the knees bilaterally  ASSESSMENT/PLAN:   Right knee pain No relief after previous steroid injection last week. Pain progressively worsening. Localized to the medial joint line. Most likely diagnosis medial meniscus injury. Patient is self-pay and cannot afford x-rays or physical therapy. Pt advised to avoid heavy lifting, climbing, or bending of the knees while at work. Pt advised to continue naproxen and tylenol.  Advised he can use topical treatments as well and he should ice the knee as much as possible.  Will follow up in one week and reassess.       , MD [facility]

## 2019-12-27 ENCOUNTER — Ambulatory Visit (INDEPENDENT_AMBULATORY_CARE_PROVIDER_SITE_OTHER): Payer: Self-pay | Admitting: Family Medicine

## 2019-12-27 ENCOUNTER — Other Ambulatory Visit: Payer: Self-pay

## 2019-12-27 DIAGNOSIS — S83411D Sprain of medial collateral ligament of right knee, subsequent encounter: Secondary | ICD-10-CM

## 2019-12-27 DIAGNOSIS — M25561 Pain in right knee: Secondary | ICD-10-CM

## 2019-12-27 MED ORDER — IBUPROFEN 800 MG PO TABS: 800.0000 mg | ORAL_TABLET | Freq: Three times a day (TID) | ORAL | 0 refills | Status: DC | PRN

## 2019-12-27 NOTE — Progress Notes (Addendum)
I saw and evaluated the patient with the Resident above. Agree with assessment and plan. Patient with one month of severe medial right knee pain, accompanied by antalgic gait favoring right leg with shortened stance phase. Examination without effusion, and strength is preserved.  Previous Ultrasound did demonstrate possible meniscal pathology, and patient was provided with a steroid injection, which he states did not offer any benefit.  - Discussed that repeated steroid injection so soon after the last is also unlikely to provide benefit. Patient expressed understanding. -Patient states he is in the process for getting [facility]. He is currently trying to obtain a new ID, as he states he lost his previous. We discussed that, once he has established [facility], he should contact us, at which time we can order an MRI.  -Provided with a work note to excuse from heavy lifting/kneeling activities.  -Educated on [place] stabilization HEP to help offload the knee.  -If unable to get [facility] situated, and remains in discomfort, RTC in 2-3 weeks, at which time he would be far enough away from last steroid injection that it would be safer to consider additional injection intervention.  Reather Laurence, DO Sports Medicine Fellow  Addendum:  I was the preceptor for this visit and available for immediate consultation.  Norton Blizzard MD Marrianne Mood

## 2019-12-27 NOTE — Assessment & Plan Note (Signed)
Slightly improved.  Patient has not been working.  Patient willing to fill out paperwork when he gets his identification for financial assistance.  At that time we can pursue MRI and further treatment. -Refilled ibuprofen prescription -Continue to ice the knee -Exercises given to patient for knee strengthening -Work note given for the next 2 weeks -Patient advised to follow-up in 2 to 3 weeks.

## 2019-12-27 NOTE — Progress Notes (Signed)
    SUBJECTIVE:   CHIEF COMPLAINT / HPI:   R knee pain: patient states that since his visit last week his pain has improved slightly.  He is currently using ibuprofen and icing it twice a day.  He has not worked since his last visit.  He states he has the paperwork for financial assistance but is not able to fill it out due to losing his identification.  He is working on getting the identification card soon.  PERTINENT  PMH / PSH: Right knee pain  OBJECTIVE:   BP 117/75   Ht 5\' 7"  (1.702 m)   Wt 205 lb (93 kg)   BMI 32.11 kg/m   General: Alert, oriented.  No acute distress Hispanic male, appears stated age. MSK: Antalgic gait.  Mild tenderness to palpation on the medial joint line of the right knee.  [place]ray test positive for pain medially.  Minimal swelling over the right knee in the medial joint space. Skin: Thickened hyperpigmented skin on the anterior knee bilaterally.  ASSESSMENT/PLAN:   Right knee pain Slightly improved.  Patient has not been working.  Patient willing to fill out paperwork when he gets his identification for financial assistance.  At that time we can pursue MRI and further treatment. -Refilled ibuprofen prescription -Continue to ice the knee -Exercises given to patient for knee strengthening -Work note given for the next 2 weeks -Patient advised to follow-up in 2 to 3 weeks.     , MD [facility]

## 2019-12-28 ENCOUNTER — Ambulatory Visit (HOSPITAL_COMMUNITY)
Admission: EM | Admit: 2019-12-28 | Discharge: 2019-12-28 | Disposition: A | Discharge status: Home / Self Care | Payer: Self-pay | Attending: Emergency Medicine | Admitting: Emergency Medicine

## 2019-12-28 ENCOUNTER — Other Ambulatory Visit: Payer: Self-pay

## 2019-12-28 DIAGNOSIS — M109 Gout, unspecified: Secondary | ICD-10-CM

## 2019-12-28 DIAGNOSIS — M25561 Pain in right knee: Secondary | ICD-10-CM

## 2019-12-28 MED ORDER — COLCHICINE 0.6 MG PO TABS: 0.6000 mg | ORAL_TABLET | Freq: Every day | ORAL | 0 refills | Status: DC

## 2019-12-28 NOTE — ED Provider Notes (Signed)
[facility]    CSN: [id number] Arrival date & time: 12/28/19  1023      History   Chief Complaint Chief Complaint  Patient presents with   Knee Pain    RT    HPI Darren Rodgers is a 39 y.o. male.   Patient presents with gout pain in his left great toe and ongoing pain in his right knee.  He states his left great toe is painful and warm and red.  He was seen here 12/07/2019 for right knee pain; treated with ibuprofen and prednisone.  He was seen by sports medicine on 12/13/2019 and 12/20/2019 and 12/27/2019.  He denies recent injury.  He denies fever, chills, numbness, weakness, paresthesias, open wounds, or other symptoms.  The history is provided by the patient. A language interpreter was used.    Past Medical History:  Diagnosis Date   Gout     Patient Active Problem List   Diagnosis Date Noted   Right knee pain 12/13/2019    Past Surgical History:  Procedure Laterality Date   FOREARM FRACTURE SURGERY Left        Home Medications    Prior to Admission medications   Medication Sig Start Date End Date Taking? Authorizing Provider  colchicine 0.6 MG tablet Take 1 tablet (0.6 mg total) by mouth daily. Take 2 tablets now then take 1 tablet one hour later. 12/28/19   Mickie Bail, NP  ibuprofen (ADVIL) 800 MG tablet Take 1 tablet (800 mg total) by mouth every 8 (eight) hours as needed for moderate pain. 12/27/19   Bohringer, Natalia Leatherwood, DO    Family History No family history on file.  Social History Social History   Tobacco Use   Smoking status: Never Smoker   Smokeless tobacco: Never Used  Substance Use Topics   Alcohol use: Yes    Alcohol/week: 10.0 standard drinks    Types: 10 drink(s) per week   Drug use: No     Allergies   Patient has no known allergies.   Review of Systems Review of Systems  Constitutional: Negative for chills and fever.  HENT: Negative for ear pain and sore throat.   Eyes: Negative for pain and visual  disturbance.  Respiratory: Negative for cough and shortness of breath.   Cardiovascular: Negative for chest pain and palpitations.  Gastrointestinal: Negative for abdominal pain and vomiting.  Genitourinary: Negative for dysuria and hematuria.  Musculoskeletal: Positive for arthralgias. Negative for back pain.  Skin: Positive for color change. Negative for wound.  Neurological: Negative for seizures, syncope, weakness and numbness.  All other systems reviewed and are negative.    Physical Exam Triage Vital Signs ED Triage Vitals  Enc Vitals Group     BP      Pulse      Resp      Temp      Temp src      SpO2      Weight      Height      Head Circumference      Peak Flow      Pain Score      Pain Loc      Pain Edu?      Excl. in GC?    No data found.  Updated Vital Signs BP 108/61 (BP Location: Right Arm)    Pulse 74    Temp 97.7 F (36.5 C)    Resp 16    Ht 5\' 7"  (1.702 m)  Wt 210 lb (95.3 kg)    SpO2 98%    BMI 32.89 kg/m   Visual Acuity Right Eye Distance:   Left Eye Distance:   Bilateral Distance:    Right Eye Near:   Left Eye Near:    Bilateral Near:     Physical Exam Vitals and nursing note reviewed.  Constitutional:      General: He is not in acute distress.    Appearance: He is well-developed and well-nourished.  HENT:     Head: Normocephalic and atraumatic.     Mouth/Throat:     Mouth: Mucous membranes are moist.  Eyes:     Conjunctiva/sclera: Conjunctivae normal.  Cardiovascular:     Rate and Rhythm: Normal rate and regular rhythm.     Heart sounds: Normal heart sounds.  Pulmonary:     Effort: Pulmonary effort is normal. No respiratory distress.     Breath sounds: Normal breath sounds.  Abdominal:     Palpations: Abdomen is soft.     Tenderness: There is no abdominal tenderness.  Musculoskeletal:        General: Tenderness present. No swelling, deformity, signs of injury or edema. Normal range of motion.     Cervical back: Neck supple.        Feet:     Comments: Right knee: no edema, erythema, lesions.   Skin:    General: Skin is warm and dry.     Capillary Refill: Capillary refill takes less than 2 seconds.     Findings: No lesion.  Neurological:     General: No focal deficit present.     Mental Status: He is alert and oriented to person, place, and time.     Sensory: No sensory deficit.     Motor: No weakness.     Gait: Gait normal.  Psychiatric:        Mood and Affect: Mood and affect and mood normal.        Behavior: Behavior normal.      UC Treatments / Results  Labs (all labs ordered are listed, but only abnormal results are displayed) Labs Reviewed - No data to display  EKG   Radiology No results found.  Procedures Procedures (including critical care time)  Medications Ordered in UC Medications - No data to display  Initial Impression / Assessment and Plan / UC Course  I have reviewed the triage vital signs and the nursing notes.  Pertinent labs & imaging results that were available during my care of the patient were reviewed by me and considered in my medical decision making (see chart for details).   Gout in left great toe.  Acute right knee pain.  Treating with colchicine.  Instructed patient to pause in taking the ibuprofen while on colchicine.  Instructed him to follow-up with his PCP if his symptoms are not improving and to continue follow-up with sports medicine for his right knee pain.  Patient agrees to plan of care.   Final Clinical Impressions(s) / UC Diagnoses   Final diagnoses:  Acute gout involving toe of left foot, unspecified cause  Acute pain of right knee     Discharge Instructions     Take the colchicine as directed.  Do not take the ibuprofen while taking the colchicine; resume the ibuprofen the next day.    Follow up with your primary care provider if your symptoms are not improving.        ED Prescriptions    Medication Sig Dispense Auth.  Provider    colchicine 0.6 MG tablet  (Status: Discontinued) Take 1 tablet (0.6 mg total) by mouth daily. Take 2 tablets now then take 1 tablet one hour later. 6 tablet Mickie Bail, NP   colchicine 0.6 MG tablet Take 1 tablet (0.6 mg total) by mouth daily. Take 2 tablets now then take 1 tablet one hour later. 6 tablet Mickie Bail, NP     I have reviewed the PDMP during this encounter.   Mickie Bail, NP 12/28/19 1236

## 2019-12-28 NOTE — ED Triage Notes (Signed)
Pt reports he has gout in his RT knee and needs meds for knee pain on Rt.

## 2019-12-28 NOTE — Discharge Instructions (Addendum)
Take the colchicine as directed.  Do not take the ibuprofen while taking the colchicine; resume the ibuprofen the next day.    Follow up with your primary care provider if your symptoms are not improving.

## 2020-01-01 ENCOUNTER — Ambulatory Visit: Payer: Self-pay | Admitting: Family Medicine

## 2020-01-16 ENCOUNTER — Ambulatory Visit: Payer: Self-pay | Admitting: Family Medicine

## 2020-02-09 IMAGING — CT CT RENAL STONE PROTOCOL
2 of 4 series · 17 of 46 positions shown, 19 images · non-contrast
Comparison: None applicable; testicular ultrasound performed
earlier today

CLINICAL DATA: Left flank pain and LLQ pain x 1 week. No hematuria
or urinary symptoms.

EXAM:
CT ABDOMEN AND PELVIS WITHOUT CONTRAST
TECHNIQUE: Multidetector CT imaging of the abdomen and pelvis was performed
following the standard protocol without IV contrast.

[Series 3: stone study 5.0 i30f 2 · axial · 0.87mm/px · z∈[+738,+1193]mm · 14 of 101 slices shown, 16 images]
[im 5/101  soft-tissue]
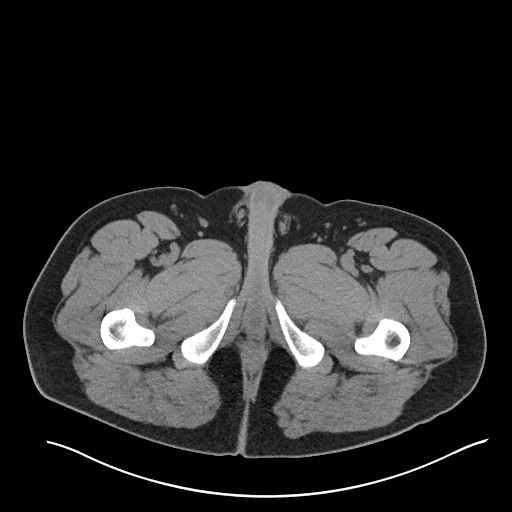
[im 5/101  bone]
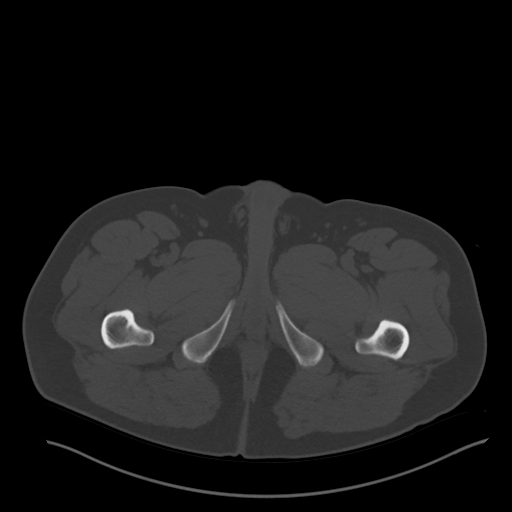
[im 14/101  soft-tissue]
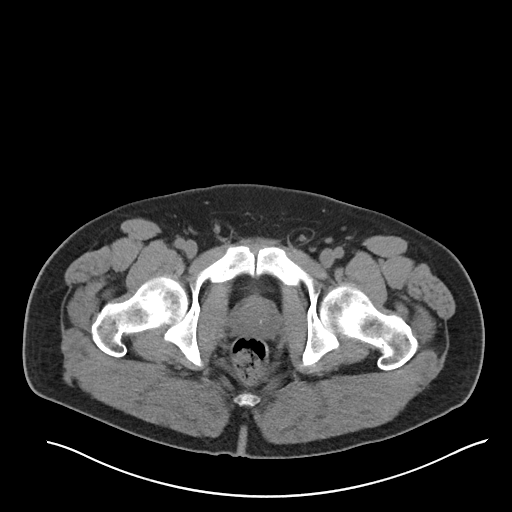
[im 19/101  soft-tissue]
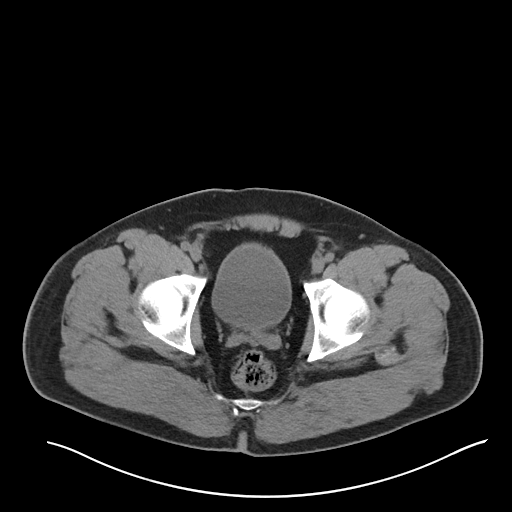
[im 28/101  soft-tissue]
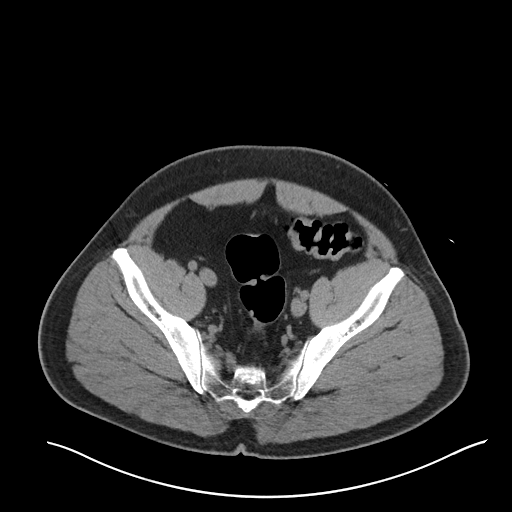
[im 32/101  soft-tissue]
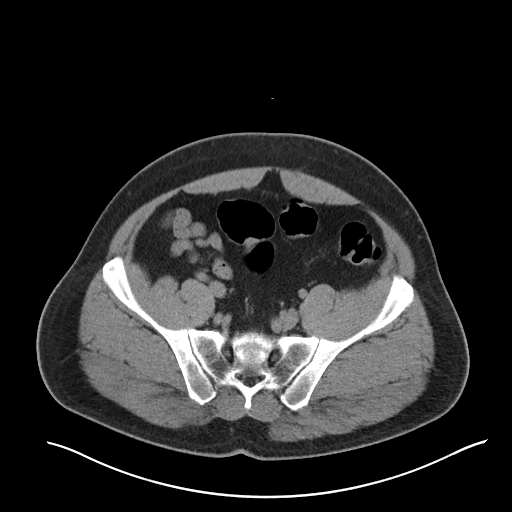
[im 41/101  soft-tissue]
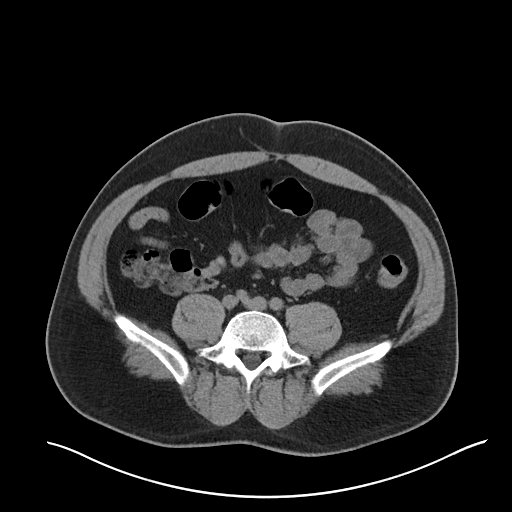
[im 46/101  soft-tissue]
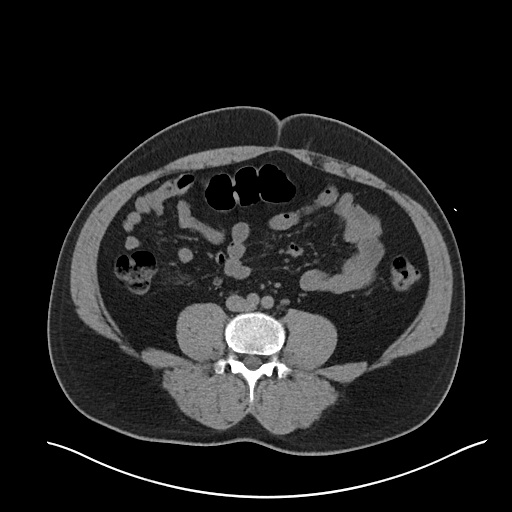
[im 55/101  soft-tissue]
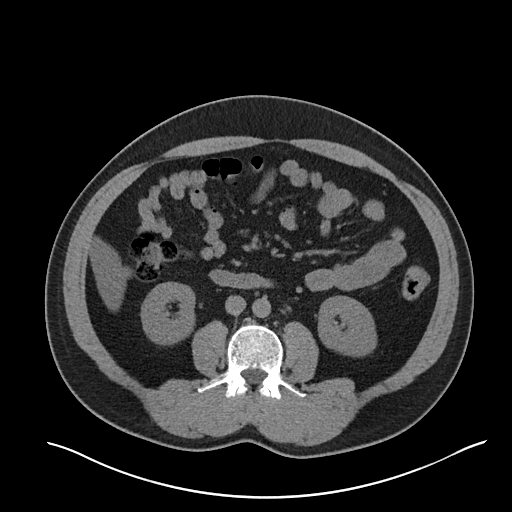
[im 60/101  soft-tissue]
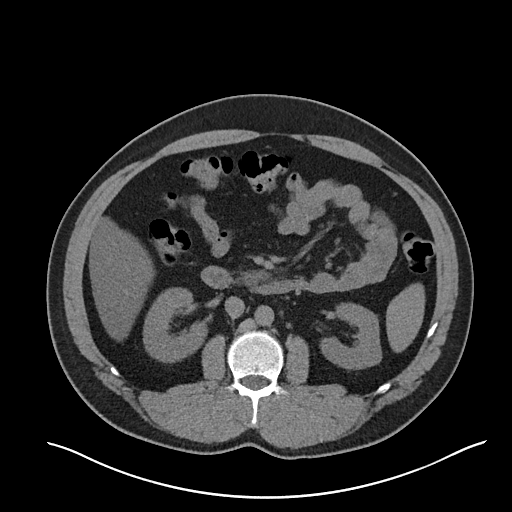
[im 60/101  bone]
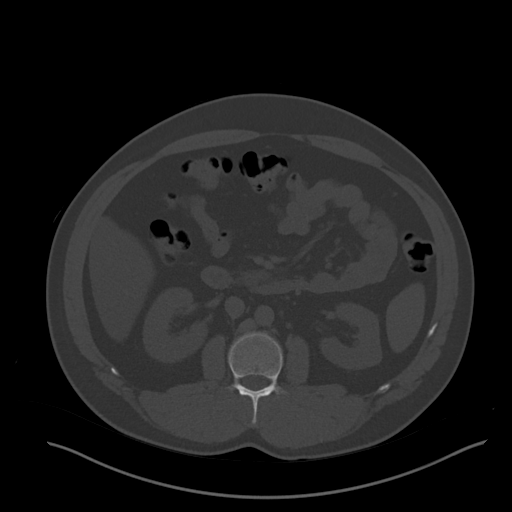
[im 69/101  soft-tissue]
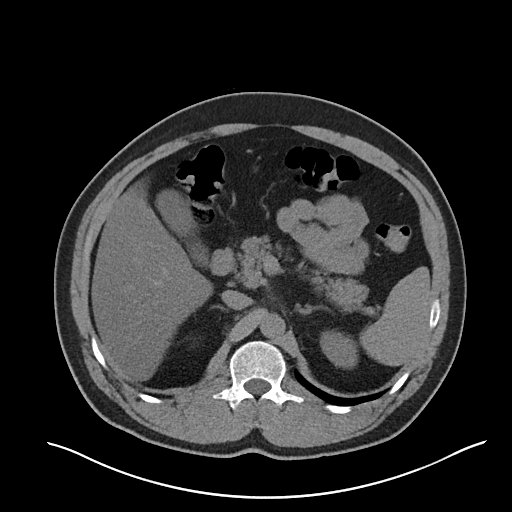
[im 73/101  soft-tissue]
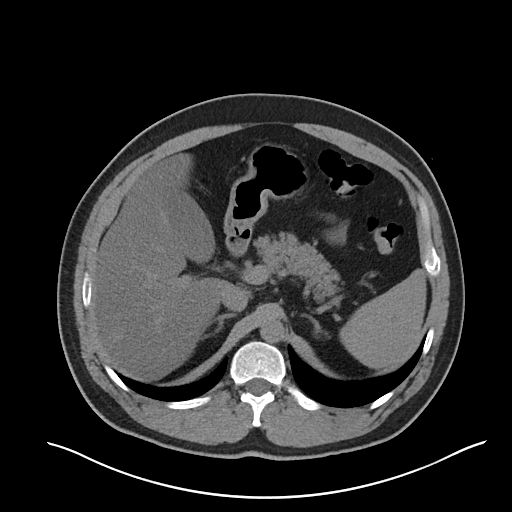
[im 82/101  soft-tissue]
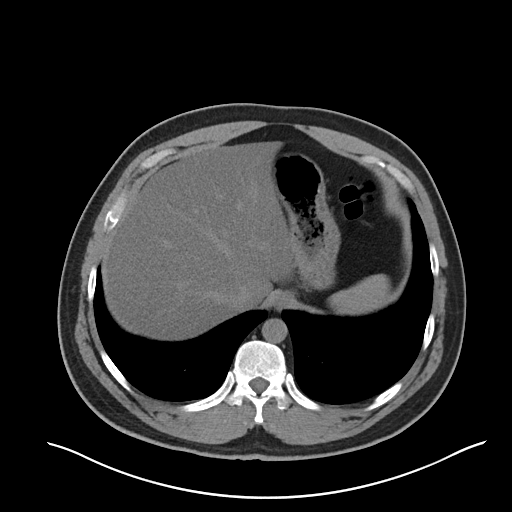
[im 87/101  soft-tissue]
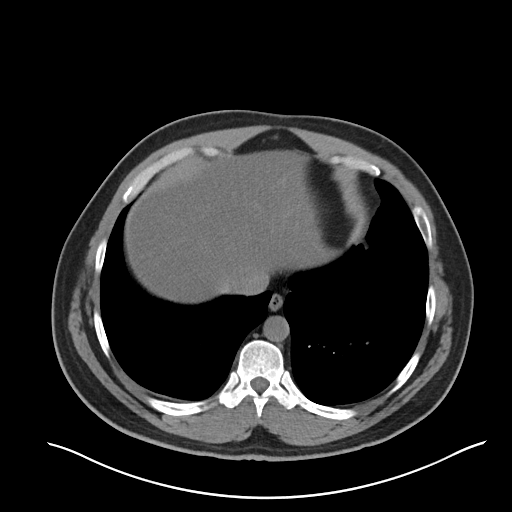
[im 96/101  soft-tissue]
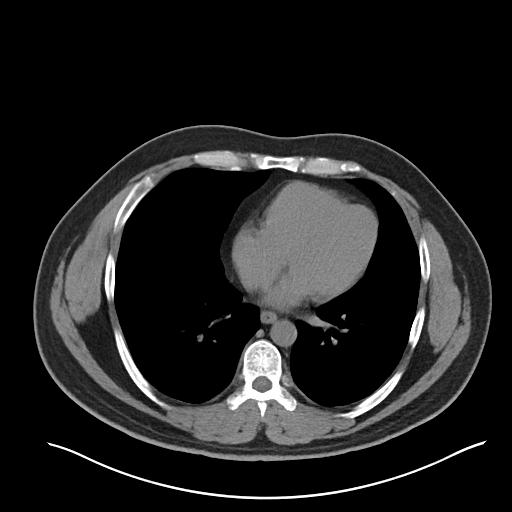

[Series 6: coronal soft tissue · coronal · 0.94mm/px · 3 of 114 slices shown]
[im 38/114  soft-tissue]
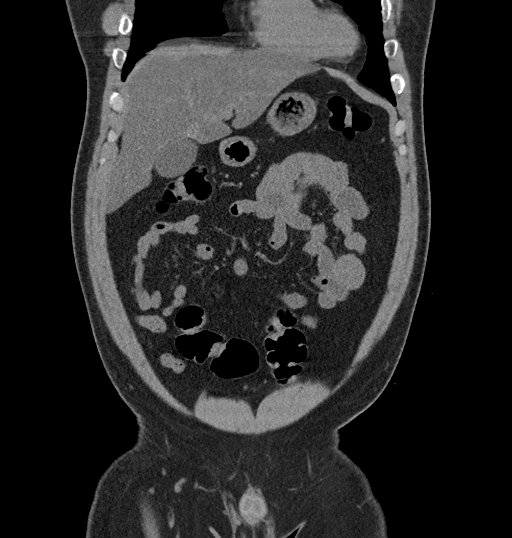
[im 51/114  soft-tissue]
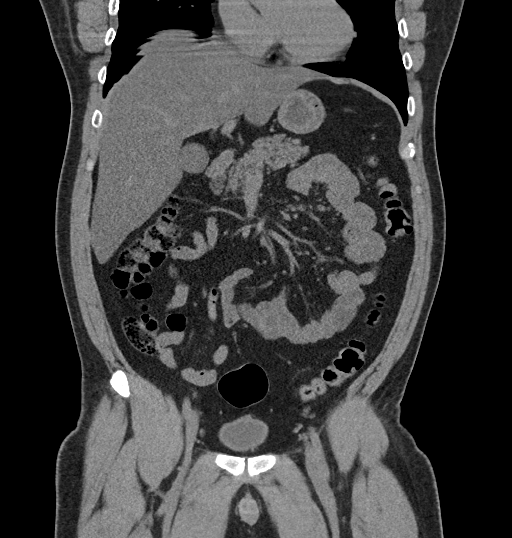
[im 63/114  soft-tissue]
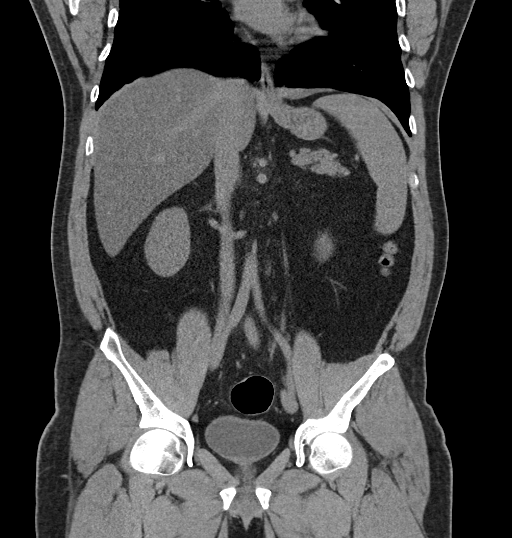

[17 of 46 positions shown; findings below may reference images not displayed]

FINDINGS: Lower chest: There is minimal subsegmental atelectasis at the RIGHT
lung base. Motion degraded images of the LOWER lungs. Heart size is
normal.

Hepatobiliary: Marked low-attenuation throughout the liver
consistent with hepatic steatosis. The gallbladder is present.

Pancreas: Unremarkable. No pancreatic ductal dilatation or
surrounding inflammatory changes.

Spleen: Normal in size without focal abnormality.

Adrenals/Urinary Tract: Adrenal glands are normal in appearance.
Kidneys are symmetric in size. No hydronephrosis. No intrarenal or
ureteral stones. The bladder and visualized portion of the urethra
are normal.

Stomach/Bowel: Stomach and small bowel loops are normal in
appearance. The appendix is well seen and has a normal appearance.
Loops of colon are unremarkable.

Vascular/Lymphatic: No significant vascular findings are present. No
enlarged abdominal or pelvic lymph nodes.

Reproductive: Prostate is unremarkable.

Other: No abdominal wall hernia or abnormality. No abdominopelvic
ascites.

Musculoskeletal: No acute or significant osseous findings.
IMPRESSION: 1. No intrarenal or ureteral stones.
2. Normal appendix.
3. Hepatic steatosis.

## 2020-07-20 ENCOUNTER — Encounter (HOSPITAL_COMMUNITY): Payer: Self-pay

## 2020-07-20 ENCOUNTER — Ambulatory Visit (HOSPITAL_COMMUNITY)
Admission: EM | Admit: 2020-07-20 | Discharge: 2020-07-20 | Disposition: A | Discharge status: Home / Self Care | Payer: Self-pay | Attending: Internal Medicine | Admitting: Internal Medicine

## 2020-07-20 DIAGNOSIS — M109 Gout, unspecified: Secondary | ICD-10-CM

## 2020-07-20 MED ORDER — COLCHICINE 0.6 MG PO TABS: 1.2000 mg | ORAL_TABLET | Freq: Every day | ORAL | 0 refills | Status: DC

## 2020-07-20 NOTE — ED Provider Notes (Signed)
[facility]    CSN: [id number] Arrival date & time: 07/20/20  1656      History   Chief Complaint Chief Complaint  Patient presents with   left toe pain   Knee Pain    HPI Darren Rodgers is a 40 y.o. male.   Patient reports to the urgent care with 1 day history of left great toe pain.  Patient suspects that he has gout due to history.  Patient has left great toe pain, redness, warmth.  Denies any injury to foot or toe.  Denies any fever.  Patient has taken colchicine in the past with resolution of symptoms.  Patient complained of right knee pain that has been present for 1 year in triage to nurse but not denies any right knee pain at this time.  Patient states that right knee pain has resolved and is not present.  Patient was offered interpreter services multiple times throughout taking history, and patient refused.   Knee Pain  Past Medical History:  Diagnosis Date   Gout     Patient Active Problem List   Diagnosis Date Noted   Right knee pain 12/13/2019    Past Surgical History:  Procedure Laterality Date   FOREARM FRACTURE SURGERY Left        Home Medications    Prior to Admission medications   Medication Sig Start Date End Date Taking? Authorizing Provider  colchicine 0.6 MG tablet Take 2 tablets (1.2 mg total) by mouth daily. Please take 2 tablets now. Then, take one tablet one hour later. 07/20/20  Yes Lance Muss, FNP  ibuprofen (ADVIL) 800 MG tablet Take 1 tablet (800 mg total) by mouth every 8 (eight) hours as needed for moderate pain. 12/27/19   Bohringer, Natalia Leatherwood, DO    Family History History reviewed. No pertinent family history.  Social History Social History   Tobacco Use   Smoking status: Never   Smokeless tobacco: Never  Substance Use Topics   Alcohol use: Not Currently    Alcohol/week: 10.0 standard drinks    Types: 10 drink(s) per week   Drug use: No     Allergies   Patient has no known allergies.   Review of  Systems Review of Systems Per HPI  Physical Exam Triage Vital Signs ED Triage Vitals  Enc Vitals Group     BP 07/20/20 1931 113/86     Pulse Rate 07/20/20 1931 78     Resp 07/20/20 1931 18     Temp 07/20/20 1931 98.6 F (37 C)     Temp Source 07/20/20 1931 Oral     SpO2 07/20/20 1931 100 %     Weight --      Height --      Head Circumference --      Peak Flow --      Pain Score 07/20/20 1929 10     Pain Loc --      Pain Edu? --      Excl. in GC? --    No data found.  Updated Vital Signs BP 113/86 (BP Location: Right Arm)   Pulse 78   Temp 98.6 F (37 C) (Oral)   Resp 18   SpO2 100%   Visual Acuity Right Eye Distance:   Left Eye Distance:   Bilateral Distance:    Right Eye Near:   Left Eye Near:    Bilateral Near:     Physical Exam Constitutional:      Appearance: Normal appearance.  HENT:     Head: Normocephalic and atraumatic.  Eyes:     Extraocular Movements: Extraocular movements intact.     Conjunctiva/sclera: Conjunctivae normal.  Pulmonary:     Effort: Pulmonary effort is normal.  Musculoskeletal:     Right foot: Normal.     Left foot: Normal capillary refill. Swelling and tenderness present. Normal pulse.     Comments: Swelling, redness, warmth to touch to left great toe.    Skin:    General: Skin is warm.  Neurological:     General: No focal deficit present.     Mental Status: He is alert and oriented to person, place, and time. Mental status is at baseline.  Psychiatric:        Mood and Affect: Mood normal.        Behavior: Behavior normal.        Thought Content: Thought content normal.        Judgment: Judgment normal.     UC Treatments / Results  Labs (all labs ordered are listed, but only abnormal results are displayed) Labs Reviewed - No data to display  EKG   Radiology No results found.  Procedures Procedures (including critical care time)  Medications Ordered in UC Medications - No data to display  Initial  Impression / Assessment and Plan / UC Course  I have reviewed the triage vital signs and the nursing notes.  Pertinent labs & imaging results that were available during my care of the patient were reviewed by me and considered in my medical decision making (see chart for details).     Clinical signs and symptoms consistent with gout diagnosis given patient history.  Will treat with colchicine.  Patient may alternate ice and heat application if needed for comfort.  Patient to follow-up if symptoms do not resolve or improve. Discussed strict return precautions. Patient verbalized understanding and is agreeable with plan.  Final Clinical Impressions(s) / UC Diagnoses   Final diagnoses:  Acute gout involving toe of left foot, unspecified cause     Discharge Instructions      You are being treated for gout with colchicine.  Please take as directed.  Please avoid any ibuprofen while taking colchicine.     ED Prescriptions     Medication Sig Dispense Auth. Provider   colchicine 0.6 MG tablet Take 2 tablets (1.2 mg total) by mouth daily. Please take 2 tablets now. Then, take one tablet one hour later. 3 tablet Lance Muss, FNP      PDMP not reviewed this encounter.   Lance Muss, FNP 07/21/20 [phone]

## 2020-07-20 NOTE — ED Triage Notes (Signed)
Pt c/o left great toe pain which he attributes to gout, since last night. Pt also c/o right knee pain for one year.   Pt declines offer for spanish interpreter in triage.

## 2020-07-20 NOTE — Discharge Instructions (Addendum)
You are being treated for gout with colchicine.  Please take as directed.  Please avoid any ibuprofen while taking colchicine.

## 2020-08-13 ENCOUNTER — Other Ambulatory Visit: Payer: Self-pay

## 2020-08-13 ENCOUNTER — Ambulatory Visit (HOSPITAL_COMMUNITY)
Admission: EM | Admit: 2020-08-13 | Discharge: 2020-08-13 | Disposition: A | Discharge status: Home / Self Care | Payer: Self-pay | Attending: Family Medicine | Admitting: Family Medicine

## 2020-08-13 ENCOUNTER — Encounter (HOSPITAL_COMMUNITY): Payer: Self-pay

## 2020-08-13 DIAGNOSIS — M109 Gout, unspecified: Secondary | ICD-10-CM

## 2020-08-13 MED ORDER — INDOMETHACIN 50 MG PO CAPS: 50.0000 mg | ORAL_CAPSULE | Freq: Three times a day (TID) | ORAL | 0 refills | Status: DC

## 2020-08-13 MED ORDER — COLCHICINE 0.6 MG PO TABS: ORAL_TABLET | ORAL | 0 refills | Status: DC

## 2020-08-13 NOTE — ED Triage Notes (Addendum)
Pt in with c/o gout flare up on both feet x 2 weeks   [Pt states he has taken colchicine with no relief and would like something stronger if possible

## 2020-08-13 NOTE — ED Provider Notes (Signed)
[facility]   [id number] 08/13/20 Arrival Time: 0930  ASSESSMENT & PLAN:  1. Acute gout involving toe of right foot, unspecified cause    No trauma. 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 Discussed.  Begin: Meds ordered this encounter  Medications   colchicine 0.6 MG tablet    Sig: Take two tablets as one dose followed one hour later by one tablet.    Dispense:  3 tablet    Refill:  0   indomethacin (INDOCIN) 50 MG capsule    Sig: Take 1 capsule (50 mg total) by mouth 3 (three) times daily with meals.    Dispense:  15 capsule    Refill:  0   Work note provided.  Recommend:  Follow-up Information     [facility] at [facility].   Specialty: Urgent Care Why: As needed. Contact information: [address] [place] [address] [phone]               Reviewed expectations re: course of current medical issues. Questions answered. Outlined signs and symptoms indicating need for more acute intervention. Patient verbalized understanding. After Visit Summary given.  SUBJECTIVE: History from: patient. Darren Rodgers is a 40 y.o. male who reports recurrent gout; 4th and 5th toes of R foot. Feels the same as previous gout. No trauma. Pain with rest and wt bearing. No tx PTA.   Past Surgical History:  Procedure Laterality Date   FOREARM FRACTURE SURGERY Left     Social History   Substance and Sexual Activity  Alcohol Use Not Currently   Alcohol/week: 10.0 standard drinks   Types: 10 drink(s) per week    OBJECTIVE:  Vitals:   08/13/20 1050  BP: 118/78  Pulse: 69  Resp: 18  Temp: 98 F (36.7 C)  SpO2: 97%    General appearance: alert; no distress HEENT: [place]; AT Neck: supple with FROM Resp: unlabored respirations Extremities: RLE: TTP over 4th and 5th toes; mild swelling and overlying erythema CV: brisk extremity capillary refill of RLE; 2+ DP pulse of RLE. Skin: warm and dry; no visible  rashes Neurologic: gait normal but favors RLE; normal sensation and strength of RLE Psychological: alert and cooperative; normal mood and affect    No Known Allergies  Past Medical History:  Diagnosis Date   Gout    Social History   Socioeconomic History   Marital status: Single    Spouse name: n/a   Number of children: 0   Years of education: 8th grade   Highest education level: Not on file  Occupational History   Occupation: Holiday representative  Tobacco Use   Smoking status: Never   Smokeless tobacco: Never  Substance and Sexual Activity   Alcohol use: Not Currently    Alcohol/week: 10.0 standard drinks    Types: 10 drink(s) per week   Drug use: No   Sexual activity: Not on file  Other Topics Concern   Not on file  Social History Narrative   From [place] (near [place]). Came to the [place] in 2005. Lives with a friend.   Social Determinants of Health   Financial Resource Strain: Not on file  Food Insecurity: Not on file  Transportation Needs: Not on file  Physical Activity: Not on file  Stress: Not on file  Social Connections: Not on file   History reviewed. No pertinent family history. Past Surgical History:  Procedure Laterality Date   FOREARM FRACTURE SURGERY Left  Mardella Layman, MD 08/13/20 1105

## 2020-08-29 ENCOUNTER — Ambulatory Visit (HOSPITAL_COMMUNITY)
Admission: EM | Admit: 2020-08-29 | Discharge: 2020-08-29 | Disposition: A | Discharge status: Home / Self Care | Payer: Self-pay | Attending: Emergency Medicine | Admitting: Emergency Medicine

## 2020-08-29 ENCOUNTER — Encounter (HOSPITAL_COMMUNITY): Payer: Self-pay | Admitting: Emergency Medicine

## 2020-08-29 ENCOUNTER — Other Ambulatory Visit: Payer: Self-pay

## 2020-08-29 DIAGNOSIS — Z8739 Personal history of other diseases of the musculoskeletal system and connective tissue: Secondary | ICD-10-CM

## 2020-08-29 DIAGNOSIS — M79674 Pain in right toe(s): Secondary | ICD-10-CM

## 2020-08-29 MED ORDER — KETOROLAC TROMETHAMINE 30 MG/ML IJ SOLN
INTRAMUSCULAR | Status: AC
  Filled 2020-08-29: qty 1

## 2020-08-29 MED ORDER — KETOROLAC TROMETHAMINE 30 MG/ML IJ SOLN
30.0000 mg | Freq: Once | INTRAMUSCULAR | Status: AC
  Administered 2020-08-29: 30 mg via INTRAMUSCULAR

## 2020-08-29 MED ORDER — COLCHICINE 0.6 MG PO TABS: 0.6000 mg | ORAL_TABLET | Freq: Every day | ORAL | 0 refills | Status: DC

## 2020-08-29 MED ORDER — COLCHICINE 0.6 MG PO TABS: ORAL_TABLET | ORAL | 0 refills | Status: DC

## 2020-08-29 NOTE — ED Triage Notes (Signed)
Pt presents with right great toe pain that started last night. States has hx of gout.

## 2020-08-29 NOTE — ED Provider Notes (Signed)
[facility]    CSN: [id number] Arrival date & time: 08/29/20  1002      History   Chief Complaint Chief Complaint  Patient presents with   Toe Pain    HPI Darren Rodgers is a 40 y.o. male.   Patient presents presents with right great toe pain and swelling beginning last night, painful to flex toes.  Pain can be felt at resting state.  Denies numbness, tingling, injury or trauma, warmth, fevers, chills, wounds.  Painful to bear weight.  History of gout.  Not currently taking medications.  Has taken colchicine and indomethacin in the past with improvement seen.  Patient has been drinking beer, says that he does not drink daily, and endorses that most meals to have some form of cheese.  Patient offered interpreter services, declined.  Somewhat difficult to understand patient.  Past Medical History:  Diagnosis Date   Gout     Patient Active Problem List   Diagnosis Date Noted   Right knee pain 12/13/2019    Past Surgical History:  Procedure Laterality Date   FOREARM FRACTURE SURGERY Left        Home Medications    Prior to Admission medications   Medication Sig Start Date End Date Taking? Authorizing Provider  colchicine 0.6 MG tablet Take 1 tablet (0.6 mg total) by mouth daily. 08/29/20  Yes Everli Rother R, NP  colchicine 0.6 MG tablet Take two tablets as one dose followed one hour later by one tablet. 08/29/20   Misk Galentine, Elita Boone, NP  indomethacin (INDOCIN) 50 MG capsule Take 1 capsule (50 mg total) by mouth 3 (three) times daily with meals. 08/13/20   Mardella Layman, MD    Family History History reviewed. No pertinent family history.  Social History Social History   Tobacco Use   Smoking status: Never   Smokeless tobacco: Never  Substance Use Topics   Alcohol use: Not Currently    Alcohol/week: 10.0 standard drinks    Types: 10 drink(s) per week   Drug use: No     Allergies   Patient has no known allergies.   Review of Systems Review of  Systems  Constitutional: Negative.   Respiratory: Negative.    Cardiovascular: Negative.   Musculoskeletal:  Positive for joint swelling. Negative for arthralgias, back pain, gait problem, myalgias, neck pain and neck stiffness.  Skin: Negative.     Physical Exam Triage Vital Signs ED Triage Vitals  Enc Vitals Group     BP 08/29/20 1019 129/90     Pulse Rate 08/29/20 1019 82     Resp 08/29/20 1019 16     Temp 08/29/20 1019 99 F (37.2 C)     Temp Source 08/29/20 1019 Oral     SpO2 08/29/20 1019 96 %     Weight --      Height --      Head Circumference --      Peak Flow --      Pain Score 08/29/20 1018 10     Pain Loc --      Pain Edu? --      Excl. in GC? --    No data found.  Updated Vital Signs BP 129/90 (BP Location: Left Arm)   Pulse 82   Temp 99 F (37.2 C) (Oral)   Resp 16   SpO2 96%   Visual Acuity Right Eye Distance:   Left Eye Distance:   Bilateral Distance:    Right Eye Near:  Left Eye Near:    Bilateral Near:     Physical Exam Constitutional:      Appearance: Normal appearance. He is normal weight.  HENT:     Head: Normocephalic.  Eyes:     Extraocular Movements: Extraocular movements intact.  Pulmonary:     Effort: Pulmonary effort is normal.  Feet:     Comments: Diffuse tenderness and swelling throughout the right great toe, no point tenderness noted, mild warmth, no erythema, skin breakdown or calluses noted, capillary refill less than 3, 2+ dorsalis pedis pulse, range of motion intact but elicits pain with flexion of toes Neurological:     Mental Status: He is alert and oriented to person, place, and time. Mental status is at baseline.  Psychiatric:        Mood and Affect: Mood normal.        Behavior: Behavior normal.     UC Treatments / Results  Labs (all labs ordered are listed, but only abnormal results are displayed) Labs Reviewed - No data to display  EKG   Radiology No results found.  Procedures Procedures (including  critical care time)  Medications Ordered in UC Medications  ketorolac (TORADOL) 30 MG/ML injection 30 mg (has no administration in time range)    Initial Impression / Assessment and Plan / UC Course  I have reviewed the triage vital signs and the nursing notes.  Pertinent labs & imaging results that were available during my care of the patient were reviewed by me and considered in my medical decision making (see chart for details).  Right great toe pain History of gout  1.  Toradol 30 mg IM now 2.  Colchicine 1.2 mg once then 0.6 mg once today 3.  Colchicine 0.6 mg daily, 1 month supply ordered 4.  PCP referral placed, stressed importance of establishing care for long-term management of gout 5.  Patient advised to avoid drinking beer, eating cheese or chocolate Final Clinical Impressions(s) / UC Diagnoses   Final diagnoses:  Great toe pain, right  History of gout     Discharge Instructions      Your toe pain is most likely related to gout today, you are being given a shot to help reduce some of the pain  Take 2 colchicine pills today and in 1 hour take 1 more pill  Starting tomorrow take colchicine every day to help reduce the chances of a gout flare  Please avoid drinking beer, eating large amounts of chocolate or cheese as these cause flare ups  A primary care referral has been placed for you to establish care with a doctor who can manage her gout long-term meaning they can write a prescription for medicine for longer than 1 month   ED Prescriptions     Medication Sig Dispense Auth. Provider   colchicine 0.6 MG tablet Take two tablets as one dose followed one hour later by one tablet. 3 tablet Marvelle Span R, NP   colchicine 0.6 MG tablet Take 1 tablet (0.6 mg total) by mouth daily. 30 tablet Valinda Hoar, NP      PDMP not reviewed this encounter.   Valinda Hoar, NP 08/29/20 1043

## 2020-08-29 NOTE — Discharge Instructions (Addendum)
Your toe pain is most likely related to gout today, you are being given a shot to help reduce some of the pain  Take 2 colchicine pills today and in 1 hour take 1 more pill  Starting tomorrow take colchicine every day to help reduce the chances of a gout flare  Please avoid drinking beer, eating large amounts of chocolate or cheese as these cause flare ups  A primary care referral has been placed for you to establish care with a doctor who can manage her gout long-term meaning they can write a prescription for medicine for longer than 1 month

## 2020-09-01 ENCOUNTER — Encounter (HOSPITAL_COMMUNITY): Payer: Self-pay | Admitting: Emergency Medicine

## 2020-09-01 ENCOUNTER — Ambulatory Visit (HOSPITAL_COMMUNITY)
Admission: EM | Admit: 2020-09-01 | Discharge: 2020-09-01 | Disposition: A | Discharge status: Home / Self Care | Payer: Self-pay | Attending: Emergency Medicine | Admitting: Emergency Medicine

## 2020-09-01 ENCOUNTER — Other Ambulatory Visit: Payer: Self-pay

## 2020-09-01 DIAGNOSIS — M79675 Pain in left toe(s): Secondary | ICD-10-CM

## 2020-09-01 DIAGNOSIS — M79672 Pain in left foot: Secondary | ICD-10-CM

## 2020-09-01 DIAGNOSIS — G8929 Other chronic pain: Secondary | ICD-10-CM

## 2020-09-01 DIAGNOSIS — M109 Gout, unspecified: Secondary | ICD-10-CM

## 2020-09-01 MED ORDER — INDOMETHACIN 50 MG PO CAPS: 50.0000 mg | ORAL_CAPSULE | Freq: Three times a day (TID) | ORAL | 0 refills | Status: DC

## 2020-09-01 MED ORDER — PREDNISONE 10 MG (21) PO TBPK: ORAL_TABLET | Freq: Every day | ORAL | 0 refills | Status: DC

## 2020-09-01 MED ORDER — COLCHICINE 0.6 MG PO TABS: ORAL_TABLET | ORAL | 0 refills | Status: DC

## 2020-09-01 NOTE — ED Triage Notes (Signed)
2 days ago was seen for swollen right foot.  Received gout medicine.  Now left foot is swollen.  No known injury.  History of gout, says this is the same.

## 2020-09-01 NOTE — Discharge Instructions (Addendum)
You will need to see a pcp for follow up and have labs completed  Keep foot elevated  Avoid foods and alcohol that may cause the flare up

## 2020-09-01 NOTE — ED Provider Notes (Signed)
[facility]    CSN: [id number] Arrival date & time: 09/01/20  6578      History   Chief Complaint Chief Complaint  Patient presents with   Foot Pain    HPI Darren Rodgers is a 40 y.o. male.   Pt was seen here on 8/20 for gout to the rt foot and toe area. Here today for lt foot and large toe gout and swelling. Pt states that he took the medication and it was getting better. Since he has been off the medications the lt foot is affected now. Painful not able to bear wt with swelling. Has not taken anything pta    Past Medical History:  Diagnosis Date   Gout     Patient Active Problem List   Diagnosis Date Noted   Right knee pain 12/13/2019    Past Surgical History:  Procedure Laterality Date   FOREARM FRACTURE SURGERY Left        Home Medications    Prior to Admission medications   Medication Sig Start Date End Date Taking? Authorizing Provider  predniSONE (STERAPRED UNI-PAK 21 TAB) 10 MG (21) TBPK tablet Take by mouth daily. Take 6 tabs by mouth daily  for 2 days, then 5 tabs for 2 days, then 4 tabs for 2 days, then 3 tabs for 2 days, 2 tabs for 2 days, then 1 tab by mouth daily for 2 days 09/01/20  Yes Maple Mirza L, NP  colchicine 0.6 MG tablet Take two tablets as one dose followed one hour later by one tablet. 09/01/20   Coralyn Mark, NP  indomethacin (INDOCIN) 50 MG capsule Take 1 capsule (50 mg total) by mouth 3 (three) times daily with meals. 09/01/20   Coralyn Mark, NP    Family History Family History  Problem Relation Age of Onset   Diabetes Father     Social History Social History   Tobacco Use   Smoking status: Some Days    Types: Cigarettes   Smokeless tobacco: Never  Vaping Use   Vaping Use: Never used  Substance Use Topics   Alcohol use: Yes    Alcohol/week: 10.0 standard drinks    Types: 10 drink(s) per week   Drug use: No     Allergies   Patient has no known allergies.   Review of Systems Review of  Systems  Constitutional: Negative.   Respiratory: Negative.    Cardiovascular: Negative.   Gastrointestinal: Negative.   Genitourinary: Negative.   Musculoskeletal:  Positive for joint swelling.  Skin:        Swelling to lt foot and big toe   Neurological: Negative.     Physical Exam Triage Vital Signs ED Triage Vitals  Enc Vitals Group     BP 09/01/20 1010 117/79     Pulse Rate 09/01/20 1010 83     Resp 09/01/20 1010 18     Temp 09/01/20 1010 97.7 F (36.5 C)     Temp Source 09/01/20 1010 Oral     SpO2 09/01/20 1010 96 %     Weight --      Height --      Head Circumference --      Peak Flow --      Pain Score 09/01/20 1002 8     Pain Loc --      Pain Edu? --      Excl. in GC? --    No data found.  Updated Vital Signs BP 117/79 (BP  Location: Right Arm)   Pulse 83   Temp 97.7 F (36.5 C) (Oral)   Resp 18   SpO2 96%   Visual Acuity Right Eye Distance:   Left Eye Distance:   Bilateral Distance:    Right Eye Near:   Left Eye Near:    Bilateral Near:     Physical Exam Constitutional:      General: He is in acute distress.  Cardiovascular:     Rate and Rhythm: Normal rate.  Pulmonary:     Effort: Pulmonary effort is normal.  Abdominal:     General: Abdomen is flat.  Musculoskeletal:        General: Swelling and tenderness present. No signs of injury.     Left lower leg: Edema present.  Skin:    Comments: Warm to touch, +2 edema to foot area and lt large toe. Strong pedal pulses. -homans   Neurological:     General: No focal deficit present.     Mental Status: He is alert.     UC Treatments / Results  Labs (all labs ordered are listed, but only abnormal results are displayed) Labs Reviewed - No data to display  EKG   Radiology No results found.  Procedures Procedures (including critical care time)  Medications Ordered in UC Medications - No data to display  Initial Impression / Assessment and Plan / UC Course  I have reviewed the  triage vital signs and the nursing notes.  Pertinent labs & imaging results that were available during my care of the patient were reviewed by me and considered in my medical decision making (see chart for details).     You will need to see a pcp for follow up and have labs completed  Keep foot elevated  Avoid foods and alcohol that may cause the flare up  Final Clinical Impressions(s) / UC Diagnoses   Final diagnoses:  Acute gout involving toe of left foot, unspecified cause  Left foot pain  Chronic toe pain, left foot     Discharge Instructions      You will need to see a pcp for follow up and have labs completed  Keep foot elevated  Avoid foods and alcohol that may cause the flare up      ED Prescriptions     Medication Sig Dispense Auth. Provider   colchicine 0.6 MG tablet Take two tablets as one dose followed one hour later by one tablet. 3 tablet Maple Mirza L, NP   indomethacin (INDOCIN) 50 MG capsule Take 1 capsule (50 mg total) by mouth 3 (three) times daily with meals. 15 capsule Maple Mirza L, NP   predniSONE (STERAPRED UNI-PAK 21 TAB) 10 MG (21) TBPK tablet Take by mouth daily. Take 6 tabs by mouth daily  for 2 days, then 5 tabs for 2 days, then 4 tabs for 2 days, then 3 tabs for 2 days, 2 tabs for 2 days, then 1 tab by mouth daily for 2 days 42 tablet Coralyn Mark, NP      PDMP not reviewed this encounter.   Coralyn Mark, NP 09/01/20 [phone]

## 2020-12-11 ENCOUNTER — Other Ambulatory Visit: Payer: Self-pay

## 2020-12-11 ENCOUNTER — Encounter (HOSPITAL_COMMUNITY): Payer: Self-pay

## 2020-12-11 ENCOUNTER — Ambulatory Visit (HOSPITAL_COMMUNITY)
Admission: EM | Admit: 2020-12-11 | Discharge: 2020-12-11 | Disposition: A | Discharge status: Home / Self Care | Payer: Self-pay | Attending: Physician Assistant | Admitting: Physician Assistant

## 2020-12-11 DIAGNOSIS — M109 Gout, unspecified: Secondary | ICD-10-CM

## 2020-12-11 MED ORDER — PREDNISONE 10 MG (21) PO TBPK: ORAL_TABLET | ORAL | 0 refills | Status: DC

## 2020-12-11 MED ORDER — COLCHICINE 0.6 MG PO TABS: ORAL_TABLET | ORAL | 0 refills | Status: DC

## 2020-12-11 NOTE — ED Provider Notes (Signed)
[facility]    CSN: [id number] Arrival date & time: 12/11/20  1705      History   Chief Complaint Chief Complaint  Patient presents with   Gait Problem    HPI Darren Rodgers is a 40 y.o. male.   Patient presents today with a 1 day history of left great toe pain.  He has a history of gout and reports current symptoms are similar to previous episodes of this condition.  He was last treated in August 2022 and required colchicine as well as prednisone for resolution of symptoms.  Pain is currently rated 10 on a 0-10 pain scale, localized to left first MTP without radiation, described as throbbing, worse with palpation or attempted ambulation, no alleviating factors identified.  He has not tried any over-the-counter medication for symptom management.  He does not currently have a PCP and has not considered preventative medication.  Denies any recent dietary changes.  Denies increase in alcohol consumption.  Denies any medication change.  Denies additional symptoms including additional areas of pain, fever, nausea, vomiting, fatigue, malaise.  He is unable to perform daily activities as result of symptoms.   Past Medical History:  Diagnosis Date   Gout     Patient Active Problem List   Diagnosis Date Noted   Right knee pain 12/13/2019    Past Surgical History:  Procedure Laterality Date   FOREARM FRACTURE SURGERY Left        Home Medications    Prior to Admission medications   Medication Sig Start Date End Date Taking? Authorizing Provider  predniSONE (STERAPRED UNI-PAK 21 TAB) 10 MG (21) TBPK tablet As directed 12/11/20  Yes Ty Buntrock K, PA-C  colchicine 0.6 MG tablet Take two tablets as one dose followed by 1 tablet daily thereafter. 12/11/20   Luzmaria Devaux, Noberto Retort, PA-C    Family History Family History  Problem Relation Age of Onset   Diabetes Father     Social History Social History   Tobacco Use   Smoking status: Some Days    Types: Cigarettes    Smokeless tobacco: Never  Vaping Use   Vaping Use: Never used  Substance Use Topics   Alcohol use: Yes    Alcohol/week: 10.0 standard drinks    Types: 10 drink(s) per week   Drug use: No     Allergies   Patient has no known allergies.   Review of Systems Review of Systems  Constitutional:  Positive for activity change. Negative for appetite change, fatigue and fever.  Respiratory:  Negative for cough and shortness of breath.   Cardiovascular:  Negative for chest pain.  Gastrointestinal:  Negative for abdominal pain, diarrhea, nausea and vomiting.  Musculoskeletal:  Positive for arthralgias, gait problem and joint swelling.  Skin:  Positive for color change. Negative for wound.  Neurological:  Negative for dizziness, weakness, light-headedness, numbness and headaches.    Physical Exam Triage Vital Signs ED Triage Vitals [12/11/20 1814]  Enc Vitals Group     BP 140/84     Pulse Rate 70     Resp 16     Temp (!) 97.1 F (36.2 C)     Temp Source Oral     SpO2 100 %     Weight      Height      Head Circumference      Peak Flow      Pain Score      Pain Loc      Pain Edu?  Excl. in GC?    No data found.  Updated Vital Signs BP 140/84 (BP Location: Left Arm)   Pulse 70   Temp (!) 97.1 F (36.2 C) (Oral)   Resp 16   SpO2 100%   Visual Acuity Right Eye Distance:   Left Eye Distance:   Bilateral Distance:    Right Eye Near:   Left Eye Near:    Bilateral Near:     Physical Exam Vitals reviewed.  Constitutional:      General: He is awake.     Appearance: Normal appearance. He is well-developed. He is not ill-appearing.     Comments: Very pleasant male appears stated age no acute distress sitting comfortably on exam room table  HENT:     Head: Normocephalic and atraumatic.     Mouth/Throat:     Pharynx: Uvula midline. No oropharyngeal exudate or posterior oropharyngeal erythema.  Cardiovascular:     Rate and Rhythm: Normal rate and regular rhythm.      Pulses:          Posterior tibial pulses are 1+ on the right side and 1+ on the left side.     Heart sounds: Normal heart sounds, S1 normal and S2 normal. No murmur heard. Pulmonary:     Effort: Pulmonary effort is normal.     Breath sounds: Normal breath sounds. No stridor. No wheezing, rhonchi or rales.     Comments: Clear to auscultation bilaterally Abdominal:     Palpations: Abdomen is soft.     Tenderness: There is no abdominal tenderness.  Musculoskeletal:     Left foot: Normal range of motion. No deformity.  Feet:     Left foot:     Protective Sensation: 10 sites tested.  10 sites sensed.     Skin integrity: Erythema and warmth present.     Toenail Condition: Left toenails are abnormally thick.     Comments: Left foot: Swelling and erythema noted at left great MTP joint.  Foot neurovascularly intact.  No deformity noted. Neurological:     Mental Status: He is alert.  Psychiatric:        Behavior: Behavior is cooperative.     UC Treatments / Results  Labs (all labs ordered are listed, but only abnormal results are displayed) Labs Reviewed - No data to display  EKG   Radiology No results found.  Procedures Procedures (including critical care time)  Medications Ordered in UC Medications - No data to display  Initial Impression / Assessment and Plan / UC Course  I have reviewed the triage vital signs and the nursing notes.  Pertinent labs & imaging results that were available during my care of the patient were reviewed by me and considered in my medical decision making (see chart for details).     No indication for x-ray given patient denies any recent trauma.  Symptoms are consistent with gout flare particularly given history.  We will treat with colchicine as well as prednisone as patient has required multiple medications for resolution of flare in the past.  He was instructed not to take NSAIDs with prednisone due to risk of GI bleeding.  Discussed given  recurrent flares he would benefit from preventative medication such as allopurinol but this would need to be arranged through primary care.  He does not currently have a PCP so we will try to establish him with 1 via PCP assistance.  Recommended he keep foot elevated and avoid prolonged ambulation.  He was provided  a work excuse note.  Discussed alarm symptoms that warrant emergent evaluation.  Strict return precautions given to which she expressed understanding.  Final Clinical Impressions(s) / UC Diagnoses   Final diagnoses:  Acute gout involving toe of left foot, unspecified cause     Discharge Instructions      Take colchicine as prescribed.  Start prednisone taper.  Do not take NSAIDs with this medication due to risk of GI bleeding.  We will try to establish you with primary care provider to consider preventative medications.  If your symptoms persist or worsen please return for reevaluation.    ED Prescriptions     Medication Sig Dispense Auth. Provider   colchicine 0.6 MG tablet Take two tablets as one dose followed by 1 tablet daily thereafter. 5 tablet Jaleia Hanke K, PA-C   predniSONE (STERAPRED UNI-PAK 21 TAB) 10 MG (21) TBPK tablet As directed 21 tablet Jesslyn Viglione K, PA-C      PDMP not reviewed this encounter.   Jeani Hawking, PA-C 12/11/20 1837

## 2020-12-11 NOTE — ED Triage Notes (Signed)
Pt is here to discuss his gout, he would like a stronger medication for pain.

## 2020-12-11 NOTE — Discharge Instructions (Signed)
Take colchicine as prescribed.  Start prednisone taper.  Do not take NSAIDs with this medication due to risk of GI bleeding.  We will try to establish you with primary care provider to consider preventative medications.  If your symptoms persist or worsen please return for reevaluation.

## 2021-04-21 ENCOUNTER — Encounter (HOSPITAL_COMMUNITY): Payer: Self-pay

## 2021-04-21 ENCOUNTER — Ambulatory Visit (HOSPITAL_COMMUNITY)
Admission: EM | Admit: 2021-04-21 | Discharge: 2021-04-21 | Disposition: A | Discharge status: Home / Self Care | Payer: Self-pay | Attending: Nurse Practitioner | Admitting: Nurse Practitioner

## 2021-04-21 DIAGNOSIS — Z8739 Personal history of other diseases of the musculoskeletal system and connective tissue: Secondary | ICD-10-CM

## 2021-04-21 DIAGNOSIS — M79674 Pain in right toe(s): Secondary | ICD-10-CM

## 2021-04-21 MED ORDER — PREDNISONE 10 MG (21) PO TBPK: ORAL_TABLET | ORAL | 0 refills | Status: DC

## 2021-04-21 MED ORDER — COLCHICINE 0.6 MG PO TABS: ORAL_TABLET | ORAL | 0 refills | Status: DC

## 2021-04-21 NOTE — Discharge Instructions (Addendum)
-   Please start the colchicine today.  Take until the toe pain improves. ?- You can also start the prednisone taper to help with inflammation ?- We will reach out to you to schedule an appointment with a primary care provider; this is important as there is prevention medication that can be given so you do not keep having gout flares.  Please make an appointment with a PCP when we reach out to you.  ? ?

## 2021-04-21 NOTE — ED Triage Notes (Addendum)
Pt reports right foot pain. He thinks he has gout. This pain started 6 weeks ago. He would like a refill on his colchicine 0.6mg . ?

## 2021-04-21 NOTE — ED Provider Notes (Signed)
?[place] ? ? ? ?CSN: [id number] ?Arrival date & time: 04/21/21  [id number] ? ? ?  ? ?History   ?Chief Complaint ?Chief Complaint  ?Patient presents with  ? Foot Pain  ? ? ?HPI ?Darren Rodgers is a 41 y.o. male.  ? ?Patient presents with medical Spanish interpreter.  He reports right great toe pain since last night.  Reports pain is severe, constant, worse with walking/light touch.  Also reports swelling, redness, warmth.  Denies any recent dietary or medicine changes.  Does not have a primary care provider.  He is requesting refill of colchicine today as he has history of frequent gout attacks-last attack was about 4 months ago. ? ?Today, the patient denies any fevers, nausea/vomiting, numbness or tingling in his foot.  He is able to bear weight on his foot however it is extremely painful.  He is requesting a note for work today. ? ? ?Past Medical History:  ?Diagnosis Date  ? Gout   ? ? ?Patient Active Problem List  ? Diagnosis Date Noted  ? Right knee pain 12/13/2019  ? ? ?Past Surgical History:  ?Procedure Laterality Date  ? FOREARM FRACTURE SURGERY Left   ? ? ? ? ? ?Home Medications   ? ?Prior to Admission medications   ?Medication Sig Start Date End Date Taking? Authorizing Provider  ?predniSONE (STERAPRED UNI-PAK 21 TAB) 10 MG (21) TBPK tablet Take 40mg  on days 1-2. Take 30mg  on days 3-4. Take 20mg  on days 5-6. Take 10mg  on days 7-8. Take 5mg  on days 9-10, then stop. 04/21/21  Yes Eulogio Bear, NP  ?colchicine 0.6 MG tablet Take two tablets as one dose followed by 1 tablet 1 hour after.  On day 2 and thereafter, take 1 tablet daily until flare resolves 04/21/21   Eulogio Bear, NP  ? ? ?Family History ?Family History  ?Problem Relation Age of Onset  ? Diabetes Father   ? ? ?Social History ?Social History  ? ?Tobacco Use  ? Smoking status: Some Days  ?  Types: Cigarettes  ? Smokeless tobacco: Never  ?Vaping Use  ? Vaping Use: Never used  ?Substance Use Topics  ? Alcohol use: Yes  ?   Alcohol/week: 10.0 standard drinks  ?  Types: 10 drink(s) per week  ? Drug use: No  ? ? ? ?Allergies   ?Patient has no known allergies. ? ? ?Review of Systems ?Review of Systems ?Per HPI ? ?Physical Exam ?Triage Vital Signs ?ED Triage Vitals  ?Enc Vitals Group  ?   BP 04/21/21 1228 122/83  ?   Pulse Rate 04/21/21 1225 75  ?   Resp 04/21/21 1225 18  ?   Temp 04/21/21 1225 97.7 ?F (36.5 ?C)  ?   Temp Source 04/21/21 1225 Oral  ?   SpO2 04/21/21 1225 97 %  ?   Weight --   ?   Height --   ?   Head Circumference --   ?   Peak Flow --   ?   Pain Score --   ?   Pain Loc --   ?   Pain Edu? --   ?   Excl. in [place]? --   ? ?No data found. ? ?Updated Vital Signs ?BP 122/83   Pulse 75   Temp 97.7 ?F (36.5 ?C) (Oral)   Resp 18   SpO2 97%  ? ?Visual Acuity ?Right Eye Distance:   ?Left Eye Distance:   ?Bilateral Distance:   ? ?Right Eye  Near:   ?Left Eye Near:    ?Bilateral Near:    ? ?Physical Exam ?Vitals and nursing note reviewed.  ?Constitutional:   ?   General: He is not in acute distress. ?   Appearance: Normal appearance. He is obese. He is not toxic-appearing.  ?Musculoskeletal:  ?   Right ankle: No swelling. No tenderness. Normal range of motion. Normal pulse.  ?   Left ankle: Normal.  ?   Right foot: Normal range of motion and normal capillary refill. Swelling, tenderness and bony tenderness present. No laceration. Normal pulse.  ?   Left foot: Normal.  ?   Comments: Right great MTP joint edematous, TTP, slightly erythematous. No warmth appreciated. Full ROM to toes.  ?Skin: ?   General: Skin is warm and dry.  ?   Capillary Refill: Capillary refill takes less than 2 seconds.  ?   Coloration: Skin is not jaundiced or pale.  ?   Findings: No erythema.  ?Neurological:  ?   Mental Status: He is alert and oriented to person, place, and time.  ?   Motor: No weakness.  ?   Gait: Gait abnormal.  ? ? ? ?UC Treatments / Results  ?Labs ?(all labs ordered are listed, but only abnormal results are displayed) ?Labs Reviewed - No data  to display ? ?EKG ? ? ?Radiology ?No results found. ? ?Procedures ?Procedures (including critical care time) ? ?Medications Ordered in UC ?Medications - No data to display ? ?Initial Impression / Assessment and Plan / UC Course  ?I have reviewed the triage vital signs and the nursing notes. ? ?Pertinent labs & imaging results that were available during my care of the patient were reviewed by me and considered in my medical decision making (see chart for details). ? ?  ?Treat gout attack with colchicine and prednisone.  Strongly encouraged establishing with primary care provider to discuss preventative medication for gout; he was added to the list.  Seek care if symptoms persist or worsen despite treatment.  Note given for work. ?Final Clinical Impressions(s) / UC Diagnoses  ? ?Final diagnoses:  ?Pain of right great toe  ?Personal history of gout  ? ? ? ?Discharge Instructions   ? ?  ?- Please start the colchicine today.  Take until the toe pain improves. ?- You can also start the prednisone taper to help with inflammation ?- We will reach out to you to schedule an appointment with a primary care provider; this is important as there is prevention medication that can be given so you do not keep having gout flares.  Please make an appointment with a PCP when we reach out to you.  ? ? ? ? ?ED Prescriptions   ? ? Medication Sig Dispense Auth. Provider  ? colchicine 0.6 MG tablet Take two tablets as one dose followed by 1 tablet 1 hour after.  On day 2 and thereafter, take 1 tablet daily until flare resolves 10 tablet Noemi Chapel A, NP  ? predniSONE (STERAPRED UNI-PAK 21 TAB) 10 MG (21) TBPK tablet Take 40mg  on days 1-2. Take 30mg  on days 3-4. Take 20mg  on days 5-6. Take 10mg  on days 7-8. Take 5mg  on days 9-10, then stop. 21 each Eulogio Bear, NP  ? ?  ? ?PDMP not reviewed this encounter. ?  ?Eulogio Bear, NP ?04/21/21 1251 ? ?

## 2021-05-30 ENCOUNTER — Encounter (HOSPITAL_COMMUNITY): Payer: Self-pay

## 2021-05-30 ENCOUNTER — Other Ambulatory Visit: Payer: Self-pay

## 2021-05-30 ENCOUNTER — Emergency Department (HOSPITAL_COMMUNITY)
Admission: EM | Admit: 2021-05-30 | Discharge: 2021-05-30 | Disposition: A | Discharge status: Home / Self Care | Payer: Self-pay | Attending: Emergency Medicine | Admitting: Emergency Medicine

## 2021-05-30 DIAGNOSIS — M10071 Idiopathic gout, right ankle and foot: Secondary | ICD-10-CM | POA: Insufficient documentation

## 2021-05-30 MED ORDER — PREDNISONE 10 MG (21) PO TBPK: ORAL_TABLET | Freq: Every day | ORAL | 0 refills | Status: DC

## 2021-05-30 MED ORDER — INDOMETHACIN 50 MG PO CAPS: 50.0000 mg | ORAL_CAPSULE | Freq: Three times a day (TID) | ORAL | 0 refills | Status: AC

## 2021-05-30 NOTE — ED Triage Notes (Signed)
Pt c/o right inner foot pain and swelling. Pt states he believes its gout that is back. Pt states he's out of the gout medicine he was given last time. Pt has traced selling of right inner foot. Pt limped to bed.

## 2021-05-30 NOTE — ED Provider Notes (Signed)
[name] EMERGENCY DEPARTMENT Provider Note   CSN: [id number] Arrival date & time: 05/30/21  1147     History  Chief Complaint  Patient presents with   Foot Pain    Darren Rodgers is a 41 y.o. male who presents to the ED for evaluation of a gout flareup of his right medial ankle.  Patient states that he has had gout in this area a few times before and also develops it in his left great toe.  He notes that his current flareup started about 2 days ago.  He denies trauma, injury, fever.  Patient does not have a primary care doctor, and he states that he does not properly follow the recommended diet that will help avoid gout flareups.  Patient denies numbness and tingling.  When asked what medications have worked for him in the past, he he states colchicine and indomethacin have worked for him.   Foot Pain      Home Medications Prior to Admission medications   Medication Sig Start Date End Date Taking? Authorizing Provider  indomethacin (INDOCIN) 50 MG capsule Take 1 capsule (50 mg total) by mouth 3 (three) times daily with meals for 5 days. 05/30/21 06/04/21 Yes Janell Quiet, PA-C  predniSONE (STERAPRED UNI-PAK 21 TAB) 10 MG (21) TBPK tablet Take by mouth daily. Take 6 tabs by mouth daily  for 2 days, then 5 tabs for 2 days, then 4 tabs for 2 days, then 3 tabs for 2 days, 2 tabs for 2 days, then 1 tab by mouth daily for 2 days 05/30/21  Yes Raynald Blend R, PA-C  colchicine 0.6 MG tablet Take two tablets as one dose followed by 1 tablet 1 hour after.  On day 2 and thereafter, take 1 tablet daily until flare resolves 04/21/21   Valentino Nose, NP      Allergies    Patient has no known allergies.    Review of Systems   Review of Systems  Physical Exam Updated Vital Signs BP 112/81   Pulse 73   Temp 98.6 F (37 C) (Oral)   Resp 16   Ht 5\' 7"  (1.702 m)   Wt 95.3 kg   SpO2 95%   BMI 32.91 kg/m  Physical Exam Vitals and nursing note reviewed.   Constitutional:      General: He is not in acute distress.    Appearance: He is not ill-appearing.  HENT:     Head: Atraumatic.  Eyes:     Conjunctiva/sclera: Conjunctivae normal.  Cardiovascular:     Rate and Rhythm: Normal rate and regular rhythm.     Pulses: Normal pulses.     Heart sounds: No murmur heard. Pulmonary:     Effort: Pulmonary effort is normal. No respiratory distress.     Breath sounds: Normal breath sounds.  Abdominal:     General: Abdomen is flat. There is no distension.     Palpations: Abdomen is soft.     Tenderness: There is no abdominal tenderness.  Musculoskeletal:        General: Normal range of motion.     Cervical back: Normal range of motion.       Feet:  Feet:     Comments: Inflammation, mild swelling and tenderness of the right medial malleolus.  2+ DP pulses Skin:    General: Skin is warm and dry.     Capillary Refill: Capillary refill takes less than 2 seconds.  Neurological:     General:  No focal deficit present.     Mental Status: He is alert.  Psychiatric:        Mood and Affect: Mood normal.    ED Results / Procedures / Treatments   Labs (all labs ordered are listed, but only abnormal results are displayed) Labs Reviewed - No data to display  EKG None  Radiology No results found.  Procedures Procedures    Medications Ordered in ED Medications - No data to display  ED Course/ Medical Decision Making/ A&P                           Medical Decision Making Risk Prescription drug management.   41 year old male in no acute distress, nontoxic-appearing presents to the ED for evaluation of a gout flareup on his right medial ankle.  Vitals without significant abnormality.  Patient limped to the bed secondary to pain.  On physical exam, he has redness, very mild swelling and tenderness to the right medial malleolus.  Other differentials aside from gout include traumatic injury, cellulitis.  Patient denies injury or trauma.  No  bruising or deformity.  Symptoms not consistent with cellulitis.  Since patient is about 48 hours after symptom onset, will prescribe indomethacin instead of colchicine and provide a steroid taper.  Patient was provided with information on low purine diet at his request.  Additionally, I strongly encouraged him to make an appointment with the [facility] for primary care follow-up as he may require allopurinol for prevention of his frequent flareups.  Referral provided.  Patient expresses understanding and is amenable to plan.  Discharged home in good condition Final Clinical Impression(s) / ED Diagnoses Final diagnoses:  Acute idiopathic gout of right ankle    Rx / DC Orders ED Discharge Orders          Ordered    indomethacin (INDOCIN) 50 MG capsule  3 times daily with meals       Note to Pharmacy: Please write instructions in spanish for patient   05/30/21 1241    predniSONE (STERAPRED UNI-PAK 21 TAB) 10 MG (21) TBPK tablet  Daily       Note to Pharmacy: Please write instructions in spanish   05/30/21 1241              Janell Quiet, [place] 05/30/21 1253    Margarita Grizzle, MD 05/31/21 1343

## 2021-05-30 NOTE — Care Management (Signed)
Recommended patient establish at [facility] in patient instructions

## 2021-05-30 NOTE — Discharge Instructions (Addendum)
You were seen today for a flareup of your gout.  I have sent you in medications that you can take to help resolve this flare. Indomethacin is an anti-inflammatory that you will take 3 times a day with meals for 5 days. Prednisone is a steroid that you will take according to package directions.  You will start with a higher dose and gradually decrease the amount that you take over several days.  Since you have been having such frequent flareups, I do strongly suggest that you follow-up with a primary care doctor.  I have provided you a referral here in your discharge papers for facility that you can call to establish care.  There are medications that you can take to help prevent gout flareups.  Additionally, I have given you some information here in your discharge about diet modifications that you can make that will also reduce the likelihood of developing gout.  Le vieron hoy por un brote de gota. Le he enviado medicamentos que puede tomar para ayudar a Industrial/product designer brote. La indometacina es un antiinflamatorio que tomar 3 veces al da con las comidas durante [address] [place]. La prednisona es un esteroide que tomar de acuerdo con las instrucciones del paquete. Comenzar con una dosis ms alta y Programmer, multimedia la cantidad que toma durante [address].  Dado que ha tenido brotes tan frecuentes, Radio broadcast assistant que haga un seguimiento con un mdico de Marine scientist. Le proporcion una referencia aqu en sus documentos de alta para un centro al que puede llamar para establecer la atencin. Existen medicamentos que puede tomar para ayudar a [employer] ataques de [place]. Adems, le he dado alguna informacin aqu en su descarga sobre las modificaciones de la dieta que puede hacer que tambin reducirn la probabilidad de Aeronautical engineer.

## 2021-09-06 ENCOUNTER — Ambulatory Visit (HOSPITAL_COMMUNITY)
Admission: EM | Admit: 2021-09-06 | Discharge: 2021-09-06 | Disposition: A | Discharge status: Home / Self Care | Payer: Self-pay | Attending: Internal Medicine | Admitting: Internal Medicine

## 2021-09-06 ENCOUNTER — Encounter (HOSPITAL_COMMUNITY): Payer: Self-pay | Admitting: *Deleted

## 2021-09-06 DIAGNOSIS — M10072 Idiopathic gout, left ankle and foot: Secondary | ICD-10-CM | POA: Insufficient documentation

## 2021-09-06 LAB — COMPREHENSIVE METABOLIC PANEL
ALT: 79 U/L — ABNORMAL HIGH (ref 0–44)
AST: 85 U/L — ABNORMAL HIGH (ref 15–41)
Albumin: 4.5 g/dL (ref 3.5–5.0)
Alkaline Phosphatase: 88 U/L (ref 38–126)
Anion gap: 8 (ref 5–15)
BUN: 16 mg/dL (ref 6–20)
CO2: 27 mmol/L (ref 22–32)
Calcium: 9.2 mg/dL (ref 8.9–10.3)
Chloride: 101 mmol/L (ref 98–111)
Creatinine, Ser: 1.02 mg/dL (ref 0.61–1.24)
GFR, Estimated: >60 mL/min (ref >60)
Glucose, Bld: 99 mg/dL (ref 70–99)
Potassium: 3.7 mmol/L (ref 3.5–5.1)
Sodium: 136 mmol/L (ref 135–145)
Total Bilirubin: 0.8 mg/dL (ref 0.3–1.2)
Total Protein: 7.8 g/dL (ref 6.5–8.1)

## 2021-09-06 LAB — URIC ACID: Uric Acid, Serum: 10.6 mg/dL — ABNORMAL HIGH (ref 3.7–8.6)

## 2021-09-06 MED ORDER — KETOROLAC TROMETHAMINE 30 MG/ML IJ SOLN
15.0000 mg | Freq: Once | INTRAMUSCULAR | Status: AC
  Administered 2021-09-06: 15 mg via INTRAMUSCULAR

## 2021-09-06 MED ORDER — ALLOPURINOL 100 MG PO TABS: 100.0000 mg | ORAL_TABLET | Freq: Every day | ORAL | 2 refills | Status: DC

## 2021-09-06 MED ORDER — KETOROLAC TROMETHAMINE 30 MG/ML IJ SOLN
INTRAMUSCULAR | Status: AC
  Filled 2021-09-06: qty 1

## 2021-09-06 MED ORDER — COLCHICINE 0.6 MG PO TABS: ORAL_TABLET | ORAL | 0 refills | Status: DC

## 2021-09-06 NOTE — ED Provider Notes (Signed)
[facility]    CSN: [id number] Arrival date & time: 09/06/21  1857      History   Chief Complaint Chief Complaint  Patient presents with   Foot Pain    HPI Darren Rodgers is a 41 y.o. male.   HPI  Past Medical History:  Diagnosis Date   Gout     Patient Active Problem List   Diagnosis Date Noted   Right knee pain 12/13/2019    Past Surgical History:  Procedure Laterality Date   FOREARM FRACTURE SURGERY Left        Home Medications    Prior to Admission medications   Medication Sig Start Date End Date Taking? Authorizing Provider  allopurinol (ZYLOPRIM) 100 MG tablet Take 1 tablet (100 mg total) by mouth daily. 09/20/21  Yes Sharline Lehane, Britta Mccreedy, MD  colchicine 0.6 MG tablet Take two tablets as one dose followed by 1 tablet 1 hour after.  On day 2 and thereafter, take 1 tablet daily until flare resolves 09/06/21   Christena Sunderlin, Britta Mccreedy, MD    Family History Family History  Problem Relation Age of Onset   Diabetes Father     Social History Social History   Tobacco Use   Smoking status: Some Days    Types: Cigarettes   Smokeless tobacco: Never  Vaping Use   Vaping Use: Never used  Substance Use Topics   Alcohol use: Not Currently    Alcohol/week: 10.0 standard drinks of alcohol    Types: 10 drink(s) per week   Drug use: No     Allergies   Patient has no known allergies.   Review of Systems Review of Systems   Physical Exam Triage Vital Signs ED Triage Vitals  Enc Vitals Group     BP 09/06/21 1953 133/89     Pulse Rate 09/06/21 1953 74     Resp 09/06/21 1953 18     Temp 09/06/21 1953 98.1 F (36.7 C)     Temp Source 09/06/21 1953 Oral     SpO2 09/06/21 1953 96 %     Weight --      Height --      Head Circumference --      Peak Flow --      Pain Score 09/06/21 1951 10     Pain Loc --      Pain Edu? --      Excl. in [place]? --    No data found.  Updated Vital Signs BP 133/89 (BP Location: Left Arm)   Pulse 74   Temp 98.1  F (36.7 C) (Oral)   Resp 18   SpO2 96%   Visual Acuity Right Eye Distance:   Left Eye Distance:   Bilateral Distance:    Right Eye Near:   Left Eye Near:    Bilateral Near:     Physical Exam   UC Treatments / Results  Labs (all labs ordered are listed, but only abnormal results are displayed) Labs Reviewed  COMPREHENSIVE METABOLIC PANEL  URIC ACID    EKG   Radiology No results found.  Procedures Procedures (including critical care time)  Medications Ordered in UC Medications  ketorolac (TORADOL) 30 MG/ML injection 15 mg (15 mg Intramuscular Given 09/06/21 2009)    Initial Impression / Assessment and Plan / UC Course  I have reviewed the triage vital signs and the nursing notes.  Pertinent labs & imaging results that were available during my care of the patient were reviewed by  me and considered in my medical decision making (see chart for details).     *** Final Clinical Impressions(s) / UC Diagnoses   Final diagnoses:  Acute idiopathic gout of left foot     Discharge Instructions      Please avoid red meat and drink alcohol in moderation Take medications as prescribed After the pain has subsided, you need to take allopurinol Please return to urgent care if you have any other symptoms.   ED Prescriptions     Medication Sig Dispense Auth. Provider   colchicine 0.6 MG tablet Take two tablets as one dose followed by 1 tablet 1 hour after.  On day 2 and thereafter, take 1 tablet daily until flare resolves 10 tablet Aayushi Solorzano, Britta Mccreedy, MD   allopurinol (ZYLOPRIM) 100 MG tablet Take 1 tablet (100 mg total) by mouth daily. 30 tablet Gianlucas Evenson, Britta Mccreedy, MD      PDMP not reviewed this encounter.

## 2021-09-06 NOTE — Discharge Instructions (Addendum)
Please avoid red meat and drink alcohol in moderation Take medications as prescribed After the pain has subsided, you need to take allopurinol Please return to urgent care if you have any other symptoms.

## 2021-09-06 NOTE — ED Triage Notes (Signed)
Pt has a hx of gout and needs refill on Colchicine, states that he ate to much red meat. The pain is in the left foot started last night.

## 2021-11-20 ENCOUNTER — Encounter (HOSPITAL_COMMUNITY): Payer: Self-pay | Admitting: Emergency Medicine

## 2021-11-20 ENCOUNTER — Ambulatory Visit (HOSPITAL_COMMUNITY)
Admission: EM | Admit: 2021-11-20 | Discharge: 2021-11-20 | Disposition: A | Discharge status: Home / Self Care | Payer: Self-pay | Attending: Internal Medicine | Admitting: Internal Medicine

## 2021-11-20 DIAGNOSIS — N481 Balanitis: Secondary | ICD-10-CM

## 2021-11-20 DIAGNOSIS — J209 Acute bronchitis, unspecified: Secondary | ICD-10-CM

## 2021-11-20 MED ORDER — PREDNISONE 20 MG PO TABS: 40.0000 mg | ORAL_TABLET | Freq: Every day | ORAL | 0 refills | Status: AC

## 2021-11-20 MED ORDER — ALBUTEROL SULFATE HFA 108 (90 BASE) MCG/ACT IN AERS
2.0000 | INHALATION_SPRAY | Freq: Once | RESPIRATORY_TRACT | Status: AC
  Administered 2021-11-20: 2 via RESPIRATORY_TRACT

## 2021-11-20 MED ORDER — ALBUTEROL SULFATE HFA 108 (90 BASE) MCG/ACT IN AERS
INHALATION_SPRAY | RESPIRATORY_TRACT | Status: AC
  Filled 2021-11-20: qty 6.7

## 2021-11-20 MED ORDER — PREDNISONE 20 MG PO TABS
ORAL_TABLET | ORAL | Status: AC
  Filled 2021-11-20: qty 2

## 2021-11-20 MED ORDER — PREDNISONE 20 MG PO TABS
40.0000 mg | ORAL_TABLET | Freq: Once | ORAL | Status: AC
  Administered 2021-11-20: 40 mg via ORAL

## 2021-11-20 NOTE — ED Triage Notes (Signed)
Pt reports a dry cough after having the flu 2 weeks ago. Also requesting a medication refill for a cream he was prescribed for a fungus.

## 2021-11-20 NOTE — ED Provider Notes (Signed)
[facility]    CSN: [id number] Arrival date & time: 11/20/21  1248      History   Chief Complaint Chief Complaint  Patient presents with   cough   Medication Refill    HPI Darren Rodgers is a 41 y.o. male.   Patient presents urgent care for evaluation of persistent dry cough that started a few weeks ago.  Patient states at initial onset of symptoms, he experienced nasal congestion, sore throat, fever/chills, cough, headache, and ear pain.  All of the after mentioned symptoms have fully resolved except for patient's dry cough.  He no longer feels ill and states the cough keeps him awake at night.  Cough has not responded well to NyQuil.  He is a smoker and denies drug use.  No history of asthma or seasonal allergies.  Denies history of chronic respiratory problems.  Denies shortness of breath, nausea, vomiting, chest pain, abdominal pain, dizziness, audible wheezing, vision changes, nasal congestion, and known sick contacts.  He would also like to be evaluated for itching and rash to the penis.  He is uncircumcised and reports he attempts to clean the penis appropriately by retracting the foreskin, cleaning, drying, then returning the foreskin to normal position.  Patient had a fungal infection to the penile area 3 months ago that went away with cream but has now returned.  Rash is red, itchy, and with some white drainage.  Denies penile discharge, recent sexual partners, concern for STD, pain to the rash, and abdominal pain.  Denies urinary symptoms.  No fever or chills.  Has not attempted use of any over-the-counter medications prior to arrival urgent care for symptoms.   Medication Refill   Past Medical History:  Diagnosis Date   Gout     Patient Active Problem List   Diagnosis Date Noted   Right knee pain 12/13/2019    Past Surgical History:  Procedure Laterality Date   FOREARM FRACTURE SURGERY Left        Home Medications    Prior to Admission  medications   Medication Sig Start Date End Date Taking? Authorizing Provider  predniSONE (DELTASONE) 20 MG tablet Take 2 tablets (40 mg total) by mouth daily for 4 days. 11/20/21 11/24/21 Yes StanhopeDonavan Burnet, FNP  allopurinol (ZYLOPRIM) 100 MG tablet Take 1 tablet (100 mg total) by mouth daily. 09/20/21   Lamptey, Britta Mccreedy, MD  colchicine 0.6 MG tablet Take two tablets as one dose followed by 1 tablet 1 hour after.  On day 2 and thereafter, take 1 tablet daily until flare resolves 09/06/21   Lamptey, Britta Mccreedy, MD    Family History Family History  Problem Relation Age of Onset   Diabetes Father     Social History Social History   Tobacco Use   Smoking status: Some Days    Types: Cigarettes   Smokeless tobacco: Never  Vaping Use   Vaping Use: Never used  Substance Use Topics   Alcohol use: Not Currently    Alcohol/week: 10.0 standard drinks of alcohol    Types: 10 drink(s) per week   Drug use: No     Allergies   Patient has no known allergies.   Review of Systems Review of Systems Per HPI  Physical Exam Triage Vital Signs ED Triage Vitals  Enc Vitals Group     BP 11/20/21 1340 115/84     Pulse Rate 11/20/21 1340 80     Resp 11/20/21 1340 18     Temp  11/20/21 1340 97.8 F (36.6 C)     Temp Source 11/20/21 1340 Oral     SpO2 11/20/21 1340 95 %     Weight --      Height --      Head Circumference --      Peak Flow --      Pain Score 11/20/21 1341 0     Pain Loc --      Pain Edu? --      Excl. in GC? --    No data found.  Updated Vital Signs BP 115/84 (BP Location: Right Arm)   Pulse 80   Temp 97.8 F (36.6 C) (Oral)   Resp 18   SpO2 95%   Visual Acuity Right Eye Distance:   Left Eye Distance:   Bilateral Distance:    Right Eye Near:   Left Eye Near:    Bilateral Near:     Physical Exam Vitals and nursing note reviewed. Exam conducted with a chaperone present.  Constitutional:      Appearance: He is not ill-appearing or toxic-appearing.   HENT:     Head: Normocephalic and atraumatic.     Right Ear: Hearing, tympanic membrane, ear canal and external ear normal.     Left Ear: Hearing, tympanic membrane, ear canal and external ear normal.     Nose: Nose normal.     Mouth/Throat:     Lips: Pink.     Mouth: Mucous membranes are moist.     Pharynx: No posterior oropharyngeal erythema.  Eyes:     General: Lids are normal. Vision grossly intact. Gaze aligned appropriately.     Extraocular Movements: Extraocular movements intact.     Conjunctiva/sclera: Conjunctivae normal.     Pupils: Pupils are equal, round, and reactive to light.  Cardiovascular:     Rate and Rhythm: Normal rate and regular rhythm.     Heart sounds: Normal heart sounds, S1 normal and S2 normal.  Pulmonary:     Effort: Pulmonary effort is normal. No respiratory distress.     Breath sounds: Normal air entry. Wheezing present.     Comments: Diffuse faint expiratory wheezing heard to auscultation of the lower lung fields.  No respiratory distress. Abdominal:     General: Abdomen is flat. Bowel sounds are normal.     Palpations: Abdomen is soft.  Genitourinary:    Penis: Uncircumcised. Erythema present.      Testes: Normal.     Comments: Erythematous penile shaft rash with purulent drainage consistent with candidal infection. Musculoskeletal:     Cervical back: Neck supple.  Lymphadenopathy:     Cervical: No cervical adenopathy.  Skin:    General: Skin is warm and dry.     Capillary Refill: Capillary refill takes less than 2 seconds.     Findings: No rash.  Neurological:     General: No focal deficit present.     Mental Status: He is alert and oriented to person, place, and time. Mental status is at baseline.     Cranial Nerves: No dysarthria or facial asymmetry.  Psychiatric:        Mood and Affect: Mood normal.        Speech: Speech normal.        Behavior: Behavior normal.        Thought Content: Thought content normal.        Judgment: Judgment  normal.      UC Treatments / Results  Labs (all labs ordered are  listed, but only abnormal results are displayed) Labs Reviewed - No data to display  EKG   Radiology No results found.  Procedures Procedures (including critical care time)  Medications Ordered in UC Medications  albuterol (VENTOLIN HFA) 108 (90 Base) MCG/ACT inhaler 2 puff (2 puffs Inhalation Given 11/20/21 1508)  predniSONE (DELTASONE) tablet 40 mg (40 mg Oral Given 11/20/21 1508)    Initial Impression / Assessment and Plan / UC Course  I have reviewed the triage vital signs and the nursing notes.  Pertinent labs & imaging results that were available during my care of the patient were reviewed by me and considered in my medical decision making (see chart for details).   1.  Acute bronchitis Presentation is consistent with acute bronchitis causing prolonged cough.  Albuterol dose given in clinic with improvement in lung sounds.  Deferred imaging based on stable cardiopulmonary exam and hemodynamically stable vital signs.  Prednisone oral burst 40 mg once daily for the next 5 days prescribed.  First dose of prednisone burst given in clinic.  Advised to take this with food to avoid stomach upset.  No NSAIDs while taking prednisone due to increased risk of GI bleeding.  PCP follow-up in the next few days if symptoms worsen or fail to improve.  2.  Balanitis Penile rash presentation is consistent with acute balanitis.  Discussed appropriate cleaning techniques to prevent this rash from happening again in the future.  Patient to apply clotrimazole cream to the penis twice daily for the next 7 days.  Discussed physical exam and available lab work findings in clinic with patient.  Counseled patient regarding appropriate use of medications and potential side effects for all medications recommended or prescribed today. Discussed red flag signs and symptoms of worsening condition,when to call the PCP office, return to urgent  care, and when to seek higher level of care in the emergency department. Patient verbalizes understanding and agreement with plan. All questions answered. Patient discharged in stable condition.    Final Clinical Impressions(s) / UC Diagnoses   Final diagnoses:  Acute bronchitis, unspecified organism  Balanitis     Discharge Instructions      Put a thin layer of clotrimazole cream to the penis 2 times daily for the next 7 days. When you clean your penis, fully pull back the foreskin, rinse the penis with water, pat the area dry fully, apply the cream, then allow the foreskin to return to normal position.  You have bronchitis.  This is causing her cough. Take prednisone once daily for the next 5 days with breakfast. Use albuterol inhaler every 6 hours as needed for cough and shortness of breath/wheeze.  If you develop any new or worsening symptoms or do not improve in the next 2 to 3 days, please return.  If your symptoms are severe, please go to the emergency room.  Follow-up with your primary care provider for further evaluation and management of your symptoms as well as ongoing wellness visits.  I hope you feel better!      ED Prescriptions     Medication Sig Dispense Auth. Provider   predniSONE (DELTASONE) 20 MG tablet Take 2 tablets (40 mg total) by mouth daily for 4 days. 8 tablet Carlisle Beers, FNP      PDMP not reviewed this encounter.   Carlisle Beers, [place] 11/23/21 2224

## 2021-11-20 NOTE — Discharge Instructions (Addendum)
Put a thin layer of clotrimazole cream to the penis 2 times daily for the next 7 days. When you clean your penis, fully pull back the foreskin, rinse the penis with water, pat the area dry fully, apply the cream, then allow the foreskin to return to normal position.  You have bronchitis.  This is causing her cough. Take prednisone once daily for the next 5 days with breakfast. Use albuterol inhaler every 6 hours as needed for cough and shortness of breath/wheeze.  If you develop any new or worsening symptoms or do not improve in the next 2 to 3 days, please return.  If your symptoms are severe, please go to the emergency room.  Follow-up with your primary care provider for further evaluation and management of your symptoms as well as ongoing wellness visits.  I hope you feel better!

## 2022-05-03 ENCOUNTER — Other Ambulatory Visit: Payer: Self-pay

## 2022-05-03 ENCOUNTER — Emergency Department (HOSPITAL_COMMUNITY)
Admission: EM | Admit: 2022-05-03 | Discharge: 2022-05-03 | Disposition: A | Discharge status: Home / Self Care | Payer: Self-pay | Attending: Emergency Medicine | Admitting: Emergency Medicine

## 2022-05-03 DIAGNOSIS — M109 Gout, unspecified: Secondary | ICD-10-CM

## 2022-05-03 MED ORDER — IBUPROFEN 600 MG PO TABS: 600.0000 mg | ORAL_TABLET | Freq: Four times a day (QID) | ORAL | 0 refills | Status: DC | PRN

## 2022-05-03 MED ORDER — KETOROLAC TROMETHAMINE 60 MG/2ML IM SOLN
60.0000 mg | Freq: Once | INTRAMUSCULAR | Status: AC
  Administered 2022-05-03: 60 mg via INTRAMUSCULAR
  Filled 2022-05-03: qty 2

## 2022-05-03 MED ORDER — PREDNISONE 10 MG PO TABS: 40.0000 mg | ORAL_TABLET | Freq: Every day | ORAL | 0 refills | Status: AC

## 2022-05-03 NOTE — ED Notes (Signed)
Pt refused crutches.

## 2022-05-03 NOTE — ED Provider Notes (Signed)
[place] EMERGENCY DEPARTMENT AT [facility] Provider Note   CSN: [id number] Arrival date & time: 05/03/22  8295     History  Chief Complaint  Patient presents with   Foot Pain    Darren Rodgers is a 42 y.o. male who primarily Spanish-speaking, Spanish translator was used, presented ED with complaint of pain and swelling in his right foot and ankle.  He has a history of recurring gout and often gets swelling in his foot and ankle.  This began approximately 3 days ago.  He is not able to bear weight due to pain.  He reports he does eat a lot of red meat and also drinks beer regularly.  He has been seen many times in the ER for this.  He does not have a PCP but he is "looking for one now."  HPI     Home Medications Prior to Admission medications   Medication Sig Start Date End Date Taking? Authorizing Provider  ibuprofen (ADVIL) 600 MG tablet Take 1 tablet (600 mg total) by mouth every 6 (six) hours as needed for mild pain or moderate pain. 05/03/22 06/02/22 Yes Tamryn Popko, Kermit Balo, MD  predniSONE (DELTASONE) 10 MG tablet Take 4 tablets (40 mg total) by mouth daily with breakfast for 5 days. 05/03/22 05/08/22 Yes Gilliam Hawkes, Kermit Balo, MD  allopurinol (ZYLOPRIM) 100 MG tablet Take 1 tablet (100 mg total) by mouth daily. 09/20/21   Lamptey, Britta Mccreedy, MD  colchicine 0.6 MG tablet Take two tablets as one dose followed by 1 tablet 1 hour after.  On day 2 and thereafter, take 1 tablet daily until flare resolves 09/06/21   Lamptey, Britta Mccreedy, MD      Allergies    Patient has no known allergies.    Review of Systems   Review of Systems  Physical Exam Updated Vital Signs BP 128/87 (BP Location: Right Arm)   Pulse 91   Temp 98.6 F (37 C) (Oral)   Resp 16   SpO2 99%  Physical Exam Constitutional:      General: He is not in acute distress. HENT:     Head: Normocephalic and atraumatic.  Eyes:     Conjunctiva/sclera: Conjunctivae normal.     Pupils: Pupils are equal, round, and  reactive to light.  Cardiovascular:     Rate and Rhythm: Normal rate and regular rhythm.  Pulmonary:     Effort: Pulmonary effort is normal. No respiratory distress.  Musculoskeletal:     Comments: Swelling, warmth involving the right ankle and the right toe, tender to touch  Skin:    General: Skin is warm and dry.  Neurological:     General: No focal deficit present.     Mental Status: He is alert. Mental status is at baseline.  Psychiatric:        Mood and Affect: Mood normal.        Behavior: Behavior normal.     ED Results / Procedures / Treatments   Labs (all labs ordered are listed, but only abnormal results are displayed) Labs Reviewed - No data to display  EKG None  Radiology No results found.  Procedures Procedures    Medications Ordered in ED Medications  ketorolac (TORADOL) injection 60 mg (has no administration in time range)    ED Course/ Medical Decision Making/ A&P  Medical Decision Making Risk Prescription drug management.   Patient is here with concern for swelling of the right ankle and the right toe.  Has a history of gout with multiple ED visits, typically symptoms resolve with steroids per his report.  I think this is likely gout again.  Low suspicion for septic joint.  Low suspicion for fracture, no traumatic mechanism.  No indication for x-rays.  I have ordered IM Toradol to be given here in the ED.  I will discharge the patient with prednisone and ibuprofen at home.  Crutches were offered for his comfort.  Advised that he ice his foot.  Most importantly, advised that he starts her PCP, as this condition could be managed as an outpatient and not require frequent return visits to the ED.  We also discussed eliminating red meats and alcohol from his diet which are known triggers of gout.  A Spanish translator was used for the entirety of my history and exam.  All questions were answered.        Final Clinical  Impression(s) / ED Diagnoses Final diagnoses:  Gout, unspecified cause, unspecified chronicity, unspecified site    Rx / DC Orders ED Discharge Orders          Ordered    predniSONE (DELTASONE) 10 MG tablet  Daily with breakfast        05/03/22 1329    ibuprofen (ADVIL) 600 MG tablet  Every 6 hours PRN        05/03/22 1329              Terald Sleeper, MD 05/03/22 1330

## 2022-05-03 NOTE — ED Triage Notes (Signed)
Patient with hx of gout reports R foot pain, mild on Sunday, and now severe since yesterday. Denies injury.

## 2022-05-24 ENCOUNTER — Ambulatory Visit: Payer: Self-pay

## 2022-05-24 ENCOUNTER — Encounter (HOSPITAL_COMMUNITY): Payer: Self-pay | Admitting: *Deleted

## 2022-05-24 ENCOUNTER — Ambulatory Visit (HOSPITAL_COMMUNITY)
Admission: EM | Admit: 2022-05-24 | Discharge: 2022-05-24 | Disposition: A | Discharge status: Home / Self Care | Payer: Self-pay | Attending: Family Medicine | Admitting: Family Medicine

## 2022-05-24 DIAGNOSIS — M109 Gout, unspecified: Secondary | ICD-10-CM

## 2022-05-24 MED ORDER — KETOROLAC TROMETHAMINE 30 MG/ML IJ SOLN
30.0000 mg | Freq: Once | INTRAMUSCULAR | Status: AC
  Administered 2022-05-24: 30 mg via INTRAMUSCULAR

## 2022-05-24 MED ORDER — PREDNISONE 20 MG PO TABS: 40.0000 mg | ORAL_TABLET | Freq: Every day | ORAL | 0 refills | Status: AC

## 2022-05-24 MED ORDER — IBUPROFEN 600 MG PO TABS: 600.0000 mg | ORAL_TABLET | Freq: Four times a day (QID) | ORAL | 0 refills | Status: AC | PRN

## 2022-05-24 MED ORDER — KETOROLAC TROMETHAMINE 30 MG/ML IJ SOLN
INTRAMUSCULAR | Status: AC
  Filled 2022-05-24: qty 1

## 2022-05-24 NOTE — ED Triage Notes (Addendum)
Via [employer] video interpreter: Reports hx gout with frequent flare-ups. States was in ED 4/23 for a recent flare-up, states he finished meds (Rx IBU and prednisone), and has had recurrence. C/O starting with pain to bottom of right heel with swelling. Pt ambulates with limp. Has appt with PCP next week

## 2022-05-24 NOTE — Telephone Encounter (Signed)
  Chief Complaint: Gout flare Symptoms: Pain redness right foot Frequency: Ongoing Pertinent Negatives: Patient denies  Disposition: [] ED /[x] Urgent Care (no appt availability in office) / [] Appointment(In office/virtual)/ []  [place] [facility]/ [] Home Care/ [] Refused Recommended Disposition /[] [facility]/ []  Follow-up with PCP Additional Notes: Pt called with ongoing gout pain. Pt has been seen in ED several times for same. Pt is wanting a refill of medication. Pt does not have a PCP. Assisted pt in setting up np appt.  Dierdre Forth at:  [facility]                                                [address] [place],  [place]  [address] Get Driving Directions Monday-Friday, 8:30 AM- 5:00 PM Closed for lunch 12:30PM-1:30 PM Main: [phone] They have a NP available fr Monday.  Pt will go to UC to be seen today.    Reason for Disposition  [1] SEVERE pain (e.g., excruciating, unable to do any normal activities) AND [2] not improved after 2 hours of pain medicine  Answer Assessment - Initial Assessment Questions 1. ONSET: "When did the pain start?"      Ongoing 2. LOCATION: "Where is the pain located?"      Right foot 3. PAIN: "How bad is the pain?"    (Scale 1-10; or mild, moderate, severe)  - MILD (1-3): doesn't interfere with normal activities.   - MODERATE (4-7): interferes with normal activities (e.g., work or school) or awakens from sleep, limping.   - SEVERE (8-10): excruciating pain, unable to do any normal activities, unable to walk.      Moderate - severe 4. WORK OR EXERCISE: "Has there been any recent work or exercise that involved this part of the body?"      no 5. CAUSE: "What do you think is causing the foot pain?"     Gout 6. OTHER SYMPTOMS: "Do you have any other symptoms?" (e.g., leg pain, rash, fever, numbness)  Protocols used: Foot Pain-A-AH

## 2022-05-24 NOTE — ED Provider Notes (Signed)
[facility]    CSN: [id number] Arrival date & time: 05/24/22  1413      History   Chief Complaint Chief Complaint  Patient presents with   Foot Pain    HPI Darren Rodgers is a 42 y.o. male.   Spanish interpreter used today.  He is here for right foot pain. He has a h/o gout.  He has been seen in the ER several times for this.  He has been seen recently and treated for gout.  He was given medication for this which was helpful.  Felt good x 2 weeks, but than ate pork and had some beers which made it flare again.  He currently has pain at the heel of the foot and comes around the foot.  It feels very hot.  His pain restarted last night.  He does have an apt with a pcp next week.    {The patient has been seen in Urgent Care in the last 3 years. :1}    Past Medical History:  Diagnosis Date   Gout     Patient Active Problem List   Diagnosis Date Noted   Right knee pain 12/13/2019    Past Surgical History:  Procedure Laterality Date   FOREARM FRACTURE SURGERY Left        Home Medications    Prior to Admission medications   Medication Sig Start Date End Date Taking? Authorizing Provider  allopurinol (ZYLOPRIM) 100 MG tablet Take 1 tablet (100 mg total) by mouth daily. 09/20/21   Lamptey, Britta Mccreedy, MD  colchicine 0.6 MG tablet Take two tablets as one dose followed by 1 tablet 1 hour after.  On day 2 and thereafter, take 1 tablet daily until flare resolves 09/06/21   Lamptey, Britta Mccreedy, MD  ibuprofen (ADVIL) 600 MG tablet Take 1 tablet (600 mg total) by mouth every 6 (six) hours as needed for mild pain or moderate pain. 05/03/22 06/02/22  Terald Sleeper, MD    Family History Family History  Problem Relation Age of Onset   Diabetes Father     Social History Social History   Tobacco Use   Smoking status: Never   Smokeless tobacco: Never  Vaping Use   Vaping Use: Never used  Substance Use Topics   Alcohol use: Not Currently    Comment: on weekends    Drug use: No     Allergies   Patient has no known allergies.   Review of Systems Review of Systems  Constitutional: Negative.   HENT: Negative.    Respiratory: Negative.    Cardiovascular: Negative.   Gastrointestinal: Negative.   Genitourinary: Negative.   Musculoskeletal:  Positive for gait problem and joint swelling.  Psychiatric/Behavioral: Negative.       Physical Exam Triage Vital Signs ED Triage Vitals  Enc Vitals Group     BP 05/24/22 1532 116/87     Pulse Rate 05/24/22 1532 70     Resp 05/24/22 1532 16     Temp 05/24/22 1532 98.1 F (36.7 C)     Temp Source 05/24/22 1532 Oral     SpO2 05/24/22 1532 97 %     Weight --      Height --      Head Circumference --      Peak Flow --      Pain Score 05/24/22 1535 9     Pain Loc --      Pain Edu? --      Excl. in  GC? --    No data found.  Updated Vital Signs BP 116/87   Pulse 70   Temp 98.1 F (36.7 C) (Oral)   Resp 16   SpO2 97%   Visual Acuity Right Eye Distance:   Left Eye Distance:   Bilateral Distance:    Right Eye Near:   Left Eye Near:    Bilateral Near:     Physical Exam Constitutional:      Appearance: Normal appearance.  Cardiovascular:     Rate and Rhythm: Normal rate and regular rhythm.  Pulmonary:     Effort: Pulmonary effort is normal.     Breath sounds: Normal breath sounds.  Musculoskeletal:     Comments: There is swelling to the right lateral ankle and back of the foot;  +TTP to the lateral and medial malleolus, right heel and pad of the foot;  decreased rom due to pain.    Neurological:     General: No focal deficit present.     Mental Status: He is alert.  Psychiatric:        Mood and Affect: Mood normal.      UC Treatments / Results  Labs (all labs ordered are listed, but only abnormal results are displayed) Labs Reviewed - No data to display  EKG   Radiology No results found.  Procedures Procedures (including critical care time)  Medications Ordered  in UC Medications  ketorolac (TORADOL) 30 MG/ML injection 30 mg (has no administration in time range)    Initial Impression / Assessment and Plan / UC Course  I have reviewed the triage vital signs and the nursing notes.  Pertinent labs & imaging results that were available during my care of the patient were reviewed by me and considered in my medical decision making (see chart for details).    Final Clinical Impressions(s) / UC Diagnoses   Final diagnoses:  Acute gout of right foot, unspecified cause     Discharge Instructions      Umeonekana leo kwa moto wa gout. Nimekupa picha ya toradol leo, na nimekutumia dawa kadhaa kwa duka lako la dawa kwa hili. Tafadhali weka miadi yako na mtoa huduma wako wa msingi wiki ijayo kwa matibabu zaidi ya gout yako.  You were seen today for a flare of gout.  I have given you a shot of toradol today, and have sent out several medications to your pharmacy for this . Please keep your appointment with your primary care provider next week for further treatment of your gout.     ED Prescriptions     Medication Sig Dispense Auth. Provider   ibuprofen (ADVIL) 600 MG tablet Take 1 tablet (600 mg total) by mouth every 6 (six) hours as needed for mild pain or moderate pain. 30 tablet Rudi Bunyard, MD   predniSONE (DELTASONE) 20 MG tablet Take 2 tablets (40 mg total) by mouth daily for 5 days. 10 tablet Jannifer Franklin, MD      PDMP not reviewed this encounter.

## 2022-05-24 NOTE — Discharge Instructions (Signed)
Umeonekana leo kwa moto wa gout. Nimekupa picha ya toradol leo, na nimekutumia dawa kadhaa kwa duka lako la dawa kwa hili. Tafadhali weka miadi yako na mtoa huduma wako wa msingi wiki ijayo kwa matibabu zaidi ya gout yako.  You were seen today for a flare of gout.  I have given you a shot of toradol today, and have sent out several medications to your pharmacy for this . Please keep your appointment with your primary care provider next week for further treatment of your gout.

## 2022-05-30 ENCOUNTER — Ambulatory Visit: Payer: Self-pay | Admitting: Student

## 2022-10-07 ENCOUNTER — Ambulatory Visit (HOSPITAL_COMMUNITY)
Admission: EM | Admit: 2022-10-07 | Discharge: 2022-10-07 | Disposition: A | Discharge status: Home / Self Care | Payer: Self-pay | Attending: Emergency Medicine | Admitting: Emergency Medicine

## 2022-10-07 ENCOUNTER — Encounter (HOSPITAL_COMMUNITY): Payer: Self-pay

## 2022-10-07 DIAGNOSIS — M109 Gout, unspecified: Secondary | ICD-10-CM

## 2022-10-07 DIAGNOSIS — M79672 Pain in left foot: Secondary | ICD-10-CM

## 2022-10-07 MED ORDER — KETOROLAC TROMETHAMINE 60 MG/2ML IM SOLN
30.0000 mg | Freq: Once | INTRAMUSCULAR | Status: AC
  Administered 2022-10-07: 30 mg via INTRAMUSCULAR

## 2022-10-07 MED ORDER — IBUPROFEN 600 MG PO TABS: 600.0000 mg | ORAL_TABLET | Freq: Four times a day (QID) | ORAL | 0 refills | Status: DC | PRN

## 2022-10-07 MED ORDER — KETOROLAC TROMETHAMINE 30 MG/ML IJ SOLN
INTRAMUSCULAR | Status: AC
  Filled 2022-10-07: qty 1

## 2022-10-07 MED ORDER — PREDNISONE 20 MG PO TABS: 40.0000 mg | ORAL_TABLET | Freq: Every day | ORAL | 0 refills | Status: AC

## 2022-10-07 NOTE — ED Triage Notes (Addendum)
Patient here today with c/o left foot pain since last night. Patient has a h/o gout. No known injury. The pain kept him up last night.

## 2022-10-07 NOTE — Discharge Instructions (Addendum)
Parece que en este momento est sufriendo un brote de gota. Le hemos administrado una dosis de Toradol en la clnica para Engineer, materials y le hemos enviado a casa con esteroides. Tmelos por la maana con el desayuno. Puede empezar a tomar ibuprofeno esta noche antes de acostarse y tomarlo con las comidas. Reduzca el consumo de alimentos con alto contenido de purinas, como el alcohol, las carnes rojas y [employer].  Es importante que se establezca con un proveedor de atencin mdica porque puede ayudarlo con el control diario de la gota y tal vez con la prevencin. Regrese a la clnica si presenta sntomas nuevos o urgentes.  You appear to be having a gout flareup at this time.  We have given you a dose of Toradol in clinic to help with your pain and send you home on steroids, take these in the morning with breakfast. You can start the ibuprofen tonight before bed, take this with food. Cut down on foods high in purine like alcohol, red meats and seafood.   It is important to get established with a care provider because they can help you with daily gout management and maybe prevention.  Return to clinic for any new or urgent symptoms.

## 2022-10-07 NOTE — ED Provider Notes (Signed)
[facility]    CSN: [id number] Arrival date & time: 10/07/22  1029      History   Chief Complaint Chief Complaint  Patient presents with   Foot Pain    HPI Darren Rodgers is a 42 y.o. male.   Patient presents to clinic for acute pain in his left foot that started suddenly last night and kept him up all night. Area is hot and tender to touch. He does have a history of gout that responds well to steroids and IM Toradol.  Reports recently he has had a lot of pork and alcohol.  He has not taken any medications for pain.   Denies injury, twisting of the foot or ankle or trauma.      The history is provided by the patient and medical records.  Foot Pain    Past Medical History:  Diagnosis Date   Gout     Patient Active Problem List   Diagnosis Date Noted   Right knee pain 12/13/2019    Past Surgical History:  Procedure Laterality Date   FOREARM FRACTURE SURGERY Left        Home Medications    Prior to Admission medications   Medication Sig Start Date End Date Taking? Authorizing Provider  ibuprofen (ADVIL) 600 MG tablet Take 1 tablet (600 mg total) by mouth every 6 (six) hours as needed. 10/07/22  Yes Rinaldo Ratel, [place], FNP  predniSONE (DELTASONE) 20 MG tablet Take 2 tablets (40 mg total) by mouth daily for 5 days. 10/07/22 10/12/22 Yes Lorell Thibodaux, [place] N, FNP    Family History Family History  Problem Relation Age of Onset   Diabetes Father     Social History Social History   Tobacco Use   Smoking status: Never   Smokeless tobacco: Never  Vaping Use   Vaping status: Never Used  Substance Use Topics   Alcohol use: Yes    Comment: on weekends   Drug use: No     Allergies   Patient has no known allergies.   Review of Systems Review of Systems  Constitutional:  Negative for fever.     Physical Exam Triage Vital Signs ED Triage Vitals  Encounter Vitals Group     BP 10/07/22 1042 120/81     Systolic BP Percentile --       Diastolic BP Percentile --      Pulse Rate 10/07/22 1042 95     Resp 10/07/22 1042 16     Temp 10/07/22 1042 98.8 F (37.1 C)     Temp Source 10/07/22 1042 Oral     SpO2 10/07/22 1042 94 %     Weight 10/07/22 1041 220 lb (99.8 kg)     Height 10/07/22 1041 5\' 7"  (1.702 m)     Head Circumference --      Peak Flow --      Pain Score 10/07/22 1040 10     Pain Loc --      Pain Education --      Exclude from Growth Chart --    No data found.  Updated Vital Signs BP 120/81 (BP Location: Left Arm)   Pulse 95   Temp 98.8 F (37.1 C) (Oral)   Resp 16   Ht 5\' 7"  (1.702 m)   Wt 220 lb (99.8 kg)   SpO2 94%   BMI 34.46 kg/m   Visual Acuity Right Eye Distance:   Left Eye Distance:   Bilateral Distance:    Right Eye  Near:   Left Eye Near:    Bilateral Near:     Physical Exam Vitals and nursing note reviewed.  Constitutional:      Appearance: Normal appearance.  HENT:     Head: Normocephalic and atraumatic.     Right Ear: External ear normal.     Left Ear: External ear normal.     Nose: Nose normal.     Mouth/Throat:     Mouth: Mucous membranes are moist.  Eyes:     Conjunctiva/sclera: Conjunctivae normal.  Cardiovascular:     Rate and Rhythm: Normal rate.     Pulses: Normal pulses.          Dorsalis pedis pulses are 2+ on the left side.       Posterior tibial pulses are 2+ on the left side.  Pulmonary:     Effort: Pulmonary effort is normal. No respiratory distress.  Musculoskeletal:        General: Tenderness present. No swelling, deformity or signs of injury. Normal range of motion.  Feet:     Left foot:     Skin integrity: Skin integrity normal.     Comments: No obvious breaks in skin, streaking or purulence. Pedal pulses 2+. Left foot is TTP. ROM intact.  Skin:    General: Skin is warm and dry.     Capillary Refill: Capillary refill takes less than 2 seconds.  Neurological:     General: No focal deficit present.     Mental Status: He is alert and oriented to  person, place, and time.  Psychiatric:        Mood and Affect: Mood normal.        Behavior: Behavior normal.      UC Treatments / Results  Labs (all labs ordered are listed, but only abnormal results are displayed) Labs Reviewed - No data to display  EKG   Radiology No results found.  Procedures Procedures (including critical care time)  Medications Ordered in UC Medications  ketorolac (TORADOL) injection 30 mg (has no administration in time range)    Initial Impression / Assessment and Plan / UC Course  I have reviewed the triage vital signs and the nursing notes.  Pertinent labs & imaging results that were available during my care of the patient were reviewed by me and considered in my medical decision making (see chart for details).  Vitals and triage reviewed, patient is hemodynamically stable.  Left foot is tender to palpation, presentation appears consistent with gout, history of same.  Imaging deferred, no trauma or injury. IM Toradol given in clinic, sent home on steroids and IBU.  Work note provided.  Plan of care, follow-up care and return precautions given, no questions at this time.    Final Clinical Impressions(s) / UC Diagnoses   Final diagnoses:  Foot pain, left  Acute gout of left foot, unspecified cause     Discharge Instructions      Parece que en este momento est sufriendo un brote de gota. Le hemos administrado una dosis de Toradol en la clnica para Engineer, materials y le hemos enviado a casa con esteroides. Tmelos por la maana con el desayuno. Puede empezar a tomar ibuprofeno esta noche antes de acostarse y tomarlo con las comidas. Reduzca el consumo de alimentos con alto contenido de purinas, como el alcohol, las carnes rojas y [employer].  Es importante que se establezca con un proveedor de atencin mdica porque puede ayudarlo con el control diario de  la gota y tal vez con la prevencin. Regrese a la clnica si presenta sntomas nuevos o  urgentes.  You appear to be having a gout flareup at this time.  We have given you a dose of Toradol in clinic to help with your pain and send you home on steroids, take these in the morning with breakfast. You can start the ibuprofen tonight before bed, take this with food. Cut down on foods high in purine like alcohol, red meats and seafood.   It is important to get established with a care provider because they can help you with daily gout management and maybe prevention.  Return to clinic for any new or urgent symptoms.       ED Prescriptions     Medication Sig Dispense Auth. Provider   predniSONE (DELTASONE) 20 MG tablet Take 2 tablets (40 mg total) by mouth daily for 5 days. 10 tablet Rinaldo Ratel, [place] N, [place]   ibuprofen (ADVIL) 600 MG tablet Take 1 tablet (600 mg total) by mouth every 6 (six) hours as needed. 30 tablet Miria Cappelli, [place] N, [place]      PDMP not reviewed this encounter.   Laticia Vannostrand, [place] N, [place] 10/07/22 1113

## 2022-11-04 ENCOUNTER — Ambulatory Visit (HOSPITAL_COMMUNITY)
Admission: EM | Admit: 2022-11-04 | Discharge: 2022-11-04 | Disposition: A | Discharge status: Home / Self Care | Payer: Self-pay | Attending: Emergency Medicine | Admitting: Emergency Medicine

## 2022-11-04 ENCOUNTER — Encounter (HOSPITAL_COMMUNITY): Payer: Self-pay

## 2022-11-04 DIAGNOSIS — M109 Gout, unspecified: Secondary | ICD-10-CM

## 2022-11-04 MED ORDER — COLCHICINE 0.6 MG PO TABS: 0.6000 mg | ORAL_TABLET | Freq: Every day | ORAL | 0 refills | Status: DC

## 2022-11-04 MED ORDER — PREDNISONE 20 MG PO TABS: 40.0000 mg | ORAL_TABLET | Freq: Every day | ORAL | 0 refills | Status: AC

## 2022-11-04 NOTE — ED Triage Notes (Signed)
Patient is requesting a refill of Prednisone 20 mg. Patient states  he is continuing to have pain in his left toe and foot.

## 2022-11-04 NOTE — ED Provider Notes (Signed)
[facility]    CSN: [id number] Arrival date & time: 11/04/22  1631      History   Chief Complaint Chief Complaint  Patient presents with   Foot Pain   Medication Refill    HPI Darren Rodgers is a 42 y.o. male.   Patient presents to clinic complaining of left great toe pain.  He was seen in clinic for a gout flare on 9/27 and started on prednisone.  Reports his foot pain had resolved with the prednisone.  He was advised to cut down on alcohol and red meat, reports he has not drink beer since his last visit but he has had red meat.  Last red meat intake was 4 days prior.  This morning the left great toe started to hurt again.  He worked all day and comes in to clinic with left great toe pain, warmth and swelling.  He is ambulatory.  Denies any injuries, trauma or falls.    The history is provided by the patient and medical records.  Foot Pain  Medication Refill   Past Medical History:  Diagnosis Date   Gout     Patient Active Problem List   Diagnosis Date Noted   Right knee pain 12/13/2019    Past Surgical History:  Procedure Laterality Date   FOREARM FRACTURE SURGERY Left        Home Medications    Prior to Admission medications   Medication Sig Start Date End Date Taking? Authorizing Provider  colchicine 0.6 MG tablet Take 1 tablet (0.6 mg total) by mouth daily. 11/04/22  Yes Rinaldo Ratel, [place] N, FNP  predniSONE (DELTASONE) 20 MG tablet Take 2 tablets (40 mg total) by mouth daily for 5 days. 11/04/22 11/09/22 Yes Rinaldo Ratel, [place] N, FNP  ibuprofen (ADVIL) 600 MG tablet Take 1 tablet (600 mg total) by mouth every 6 (six) hours as needed. 10/07/22   Breda Bond, [place] N, FNP    Family History Family History  Problem Relation Age of Onset   Diabetes Father     Social History Social History   Tobacco Use   Smoking status: Never   Smokeless tobacco: Never  Vaping Use   Vaping status: Never Used  Substance Use Topics   Alcohol use: Yes     Comment: on weekends   Drug use: No     Allergies   Patient has no known allergies.   Review of Systems Review of Systems  Per HPI   Physical Exam Triage Vital Signs ED Triage Vitals  Encounter Vitals Group     BP 11/04/22 1658 106/73     Systolic BP Percentile --      Diastolic BP Percentile --      Pulse Rate 11/04/22 1658 75     Resp 11/04/22 1658 16     Temp 11/04/22 1658 98.9 F (37.2 C)     Temp Source 11/04/22 1658 Oral     SpO2 11/04/22 1658 95 %     Weight --      Height --      Head Circumference --      Peak Flow --      Pain Score 11/04/22 1701 9     Pain Loc --      Pain Education --      Exclude from Growth Chart --    No data found.  Updated Vital Signs BP 106/73 (BP Location: Right Arm)   Pulse 75   Temp 98.9 F (37.2 C) (Oral)  Resp 16   SpO2 95%   Visual Acuity Right Eye Distance:   Left Eye Distance:   Bilateral Distance:    Right Eye Near:   Left Eye Near:    Bilateral Near:     Physical Exam Vitals and nursing note reviewed.  Constitutional:      Appearance: Normal appearance.  HENT:     Head: Normocephalic and atraumatic.     Right Ear: External ear normal.     Left Ear: External ear normal.     Nose: Nose normal.     Mouth/Throat:     Mouth: Mucous membranes are moist.  Eyes:     Conjunctiva/sclera: Conjunctivae normal.  Cardiovascular:     Rate and Rhythm: Normal rate.     Pulses: Normal pulses.          Dorsalis pedis pulses are 2+ on the left side.       Posterior tibial pulses are 2+ on the left side.  Musculoskeletal:        General: Tenderness present. Normal range of motion.       Feet:  Feet:     Left foot:     Skin integrity: Erythema and warmth present.     Comments: Pain, warmth and swelling at left great toe DIP joint.  Skin:    General: Skin is warm and dry.     Findings: Erythema present.  Neurological:     General: No focal deficit present.     Mental Status: He is alert and oriented to  person, place, and time.  Psychiatric:        Mood and Affect: Mood normal.        Behavior: Behavior normal. Behavior is cooperative.      UC Treatments / Results  Labs (all labs ordered are listed, but only abnormal results are displayed) Labs Reviewed - No data to display  EKG   Radiology No results found.  Procedures Procedures (including critical care time)  Medications Ordered in UC Medications - No data to display  Initial Impression / Assessment and Plan / UC Course  I have reviewed the triage vital signs and the nursing notes.  Pertinent labs & imaging results that were available during my care of the patient were reviewed by me and considered in my medical decision making (see chart for details).  Vitals and triage reviewed, patient is hemodynamically stable.  Suspect recurrent gout flare due to high purine diet.  Atraumatic, imaging deferred at this time.  Will place back on steroid burst and colchicine, since attack started this morning.  Encouraged adherence to low purine diet.  Plan of care, follow-up care and return precautions given, no questions at this time.     Final Clinical Impressions(s) / UC Diagnoses   Final diagnoses:  Gout involving toe of left foot, unspecified cause, unspecified chronicity     Discharge Instructions      Tome colchicina todos los [address], [address] que el dolor mejore. Comience a tomar esteroides maana con el desayuno para Engineer, materials y la inflamacin. Asegrese de seguir un plan de alimentacin con bajo contenido de purinas, evitando el alcohol y las carnes rojas. Realice un seguimiento con un mdico de atencin primaria en caso de ataques de gota recurrentes, ya que puede ayudar a prevenirlos.  Take the colchicine daily until your pain improves.  Start the steroids tomorrow with breakfast to help with pain and inflammation.  Ensure you are following a low purine eating plan,  avoiding alcohol and red meats. Please follow-up  with a primary care provider regarding recurrent gout attacks, as they may be able to help with prevention of attacks.       ED Prescriptions     Medication Sig Dispense Auth. Provider   colchicine 0.6 MG tablet Take 1 tablet (0.6 mg total) by mouth daily. 30 tablet Rinaldo Ratel, [place], [place]   predniSONE (DELTASONE) 20 MG tablet Take 2 tablets (40 mg total) by mouth daily for 5 days. 10 tablet Kylena Mole, [place] N, FNP      PDMP not reviewed this encounter.   Kimberly Nieland, [place] N, [place] 11/04/22 1729

## 2022-11-04 NOTE — Discharge Instructions (Addendum)
Tome [employer] que el dolor mejore. Comience a tomar esteroides maana con el desayuno para Engineer, materials y la inflamacin. Asegrese de seguir un plan de alimentacin con bajo contenido de purinas, evitando el alcohol y las carnes rojas. Realice un seguimiento con un mdico de atencin primaria en caso de ataques de gota recurrentes, ya que puede ayudar a prevenirlos.  Take the colchicine daily until your pain improves.  Start the steroids tomorrow with breakfast to help with pain and inflammation.  Ensure you are following a low purine eating plan, avoiding alcohol and red meats. Please follow-up with a primary care provider regarding recurrent gout attacks, as they may be able to help with prevention of attacks.

## 2023-03-23 ENCOUNTER — Ambulatory Visit (HOSPITAL_COMMUNITY)
Admission: EM | Admit: 2023-03-23 | Discharge: 2023-03-23 | Disposition: A | Discharge status: Home / Self Care | Payer: Self-pay | Attending: Family Medicine | Admitting: Family Medicine

## 2023-03-23 ENCOUNTER — Other Ambulatory Visit: Payer: Self-pay

## 2023-03-23 ENCOUNTER — Encounter (HOSPITAL_COMMUNITY): Payer: Self-pay | Admitting: *Deleted

## 2023-03-23 DIAGNOSIS — M25561 Pain in right knee: Secondary | ICD-10-CM

## 2023-03-23 DIAGNOSIS — M109 Gout, unspecified: Secondary | ICD-10-CM

## 2023-03-23 MED ORDER — KETOROLAC TROMETHAMINE 30 MG/ML IJ SOLN
30.0000 mg | Freq: Once | INTRAMUSCULAR | Status: AC
  Administered 2023-03-23: 30 mg via INTRAMUSCULAR

## 2023-03-23 MED ORDER — KETOROLAC TROMETHAMINE 30 MG/ML IJ SOLN
INTRAMUSCULAR | Status: AC
  Filled 2023-03-23: qty 1

## 2023-03-23 MED ORDER — PREDNISONE 20 MG PO TABS: 40.0000 mg | ORAL_TABLET | Freq: Every day | ORAL | 0 refills | Status: AC

## 2023-03-23 MED ORDER — COLCHICINE 0.6 MG PO TABS: 0.6000 mg | ORAL_TABLET | Freq: Every day | ORAL | 0 refills | Status: DC

## 2023-03-23 NOTE — Discharge Instructions (Signed)
 Le revisaron hoy por un brote de gota. Le administr una inyeccin de analgsicos. Tambin le [employer]. Tmelos segn las indicaciones. Tambin puede tomar Motrin para Chief Technology Officer y la inflamacin. Regrese si no mejora como se esperaba o si presenta fiebre o escalofros.  You were seen today for a flare of gout. I have given you a shot of pain medication today.  I have sent out two medications as well.  Please take as directed.  You may take motrin for pain and swelling also.  Return if not improving as expected, or if you develop fevers or chills.

## 2023-03-23 NOTE — ED Triage Notes (Signed)
 PT reports pain to RT knee due to Gout.

## 2023-03-23 NOTE — ED Provider Notes (Signed)
 [facility]    CSN: [id number] Arrival date & time: 03/23/23  1032      History   Chief Complaint Chief Complaint  Patient presents with   Gout    HPI Darren Rodgers is a 43 y.o. male.   Patient is here for gout to the right knee.  Pain started yesterday.  Swelling and redness to the knee.  He has h/o gout, usually not in this location, but his foot/toe.  No fevers/chills.  No known injury.  No medications taken.        Past Medical History:  Diagnosis Date   Gout     Patient Active Problem List   Diagnosis Date Noted   Right knee pain 12/13/2019    Past Surgical History:  Procedure Laterality Date   FOREARM FRACTURE SURGERY Left        Home Medications    Prior to Admission medications   Medication Sig Start Date End Date Taking? Authorizing Provider  colchicine 0.6 MG tablet Take 1 tablet (0.6 mg total) by mouth daily. 11/04/22   Garrison, [place] N, FNP  ibuprofen (ADVIL) 600 MG tablet Take 1 tablet (600 mg total) by mouth every 6 (six) hours as needed. 10/07/22   Garrison, [place] N, FNP    Family History Family History  Problem Relation Age of Onset   Diabetes Father     Social History Social History   Tobacco Use   Smoking status: Never   Smokeless tobacco: Never  Vaping Use   Vaping status: Never Used  Substance Use Topics   Alcohol use: Yes    Comment: on weekends   Drug use: No     Allergies   Patient has no known allergies.   Review of Systems Review of Systems  Constitutional: Negative.   HENT: Negative.    Respiratory: Negative.    Cardiovascular: Negative.   Gastrointestinal: Negative.   Musculoskeletal:  Positive for joint swelling.     Physical Exam Triage Vital Signs ED Triage Vitals  Encounter Vitals Group     BP 03/23/23 1225 134/85     Systolic BP Percentile --      Diastolic BP Percentile --      Pulse Rate 03/23/23 1225 69     Resp 03/23/23 1225 18     Temp 03/23/23 1225 98 F (36.7 C)      Temp src --      SpO2 03/23/23 1225 96 %     Weight --      Height --      Head Circumference --      Peak Flow --      Pain Score 03/23/23 1222 10     Pain Loc --      Pain Education --      Exclude from Growth Chart --    No data found.  Updated Vital Signs BP 134/85   Pulse 69   Temp 98 F (36.7 C)   Resp 18   SpO2 96%   Visual Acuity Right Eye Distance:   Left Eye Distance:   Bilateral Distance:    Right Eye Near:   Left Eye Near:    Bilateral Near:     Physical Exam Constitutional:      Appearance: Normal appearance.  Cardiovascular:     Rate and Rhythm: Normal rate and regular rhythm.  Pulmonary:     Effort: Pulmonary effort is normal.     Breath sounds: Normal breath sounds.  Musculoskeletal:  Comments: The right anterior knee is red, warm and swollen;  no breaks to the skin;  pain with flexion of the knee  Neurological:     General: No focal deficit present.     Mental Status: He is alert.  Psychiatric:        Mood and Affect: Mood normal.      UC Treatments / Results  Labs (all labs ordered are listed, but only abnormal results are displayed) Labs Reviewed - No data to display  EKG   Radiology No results found.  Procedures Procedures (including critical care time)  Medications Ordered in UC Medications - No data to display  Initial Impression / Assessment and Plan / UC Course  I have reviewed the triage vital signs and the nursing notes.  Pertinent labs & imaging results that were available during my care of the patient were reviewed by me and considered in my medical decision making (see chart for details).   Final Clinical Impressions(s) / UC Diagnoses   Final diagnoses:  Acute gout of right knee, unspecified cause     Discharge Instructions      Le revisaron hoy por un brote de gota. Le administr una inyeccin de analgsicos. Tambin le [employer]. Tmelos segn las indicaciones. Tambin puede tomar  Motrin para Chief Technology Officer y la inflamacin. Regrese si no mejora como se esperaba o si presenta fiebre o escalofros.  You were seen today for a flare of gout. I have given you a shot of pain medication today.  I have sent out two medications as well.  Please take as directed.  You may take motrin for pain and swelling also.  Return if not improving as expected, or if you develop fevers or chills.      ED Prescriptions     Medication Sig Dispense Auth. Provider   colchicine 0.6 MG tablet Take 1 tablet (0.6 mg total) by mouth daily for 5 days. 5 tablet Maureen Duesing, MD   predniSONE (DELTASONE) 20 MG tablet Take 2 tablets (40 mg total) by mouth daily for 5 days. 10 tablet Jannifer Franklin, MD      PDMP not reviewed this encounter.   Jannifer Franklin, MD 03/23/23 1246

## 2023-04-12 ENCOUNTER — Encounter (HOSPITAL_COMMUNITY): Payer: Self-pay

## 2023-04-12 ENCOUNTER — Ambulatory Visit (HOSPITAL_COMMUNITY)
Admission: EM | Admit: 2023-04-12 | Discharge: 2023-04-12 | Disposition: A | Discharge status: Home / Self Care | Payer: Self-pay | Attending: Emergency Medicine | Admitting: Emergency Medicine

## 2023-04-12 DIAGNOSIS — M109 Gout, unspecified: Secondary | ICD-10-CM

## 2023-04-12 MED ORDER — KETOROLAC TROMETHAMINE 60 MG/2ML IM SOLN
30.0000 mg | Freq: Once | INTRAMUSCULAR | Status: AC
  Administered 2023-04-12: 30 mg via INTRAMUSCULAR

## 2023-04-12 MED ORDER — KETOROLAC TROMETHAMINE 30 MG/ML IJ SOLN
INTRAMUSCULAR | Status: AC
  Filled 2023-04-12: qty 1

## 2023-04-12 MED ORDER — PREDNISONE 20 MG PO TABS: 40.0000 mg | ORAL_TABLET | Freq: Every day | ORAL | 0 refills | Status: AC

## 2023-04-12 NOTE — Discharge Instructions (Addendum)
 Evite la cerveza y la carne de cerdo, ya que se sabe que pueden causar brotes de gota. Tome los esteroides diariamente con el desayuno durante los prximos 5 [place]. Regrese a la clnica ante cualquier sntoma nuevo o urgente.  Avoid beer and pork, as these are known to cause flareups of your gout.  Take the steroids daily with breakfast for the next 5 days. Return to clinic for any new or urgent symptoms.

## 2023-04-12 NOTE — ED Provider Notes (Signed)
 [facility]    CSN: [id number] Arrival date & time: 04/12/23  1905      History   Chief Complaint Chief Complaint  Patient presents with   Gout    HPI Darren Rodgers is a 43 y.o. male.   Patient declined medical interpreter, is able to speak [place].  Endorses left great toe pain that started shortly after drinking beer and eating a lot of pork over the weekend.  Would like a refill of his prednisone and colchicine.  Does have a history of gout.  Was recently seen back in March for a gout flareup of his right knee.  Has not tried any medications or interventions for his pain.  Denies any trauma, falls or injuries to the foot or toe.  The history is provided by the patient and medical records.    Past Medical History:  Diagnosis Date   Gout     Patient Active Problem List   Diagnosis Date Noted   Right knee pain 12/13/2019    Past Surgical History:  Procedure Laterality Date   FOREARM FRACTURE SURGERY Left        Home Medications    Prior to Admission medications   Medication Sig Start Date End Date Taking? Authorizing Provider  predniSONE (DELTASONE) 20 MG tablet Take 2 tablets (40 mg total) by mouth daily for 5 days. 04/12/23 04/17/23 Yes Nakiah Osgood, [place] N, FNP    Family History Family History  Problem Relation Age of Onset   Diabetes Father     Social History Social History   Tobacco Use   Smoking status: Never   Smokeless tobacco: Never  Vaping Use   Vaping status: Never Used  Substance Use Topics   Alcohol use: Yes    Comment: on weekends   Drug use: No     Allergies   Patient has no known allergies.   Review of Systems Review of Systems  Per HPI  Physical Exam Triage Vital Signs ED Triage Vitals [04/12/23 1938]  Encounter Vitals Group     BP 112/79     Systolic BP Percentile      Diastolic BP Percentile      Pulse Rate 74     Resp 18     Temp 98.1 F (36.7 C)     Temp Source Oral     SpO2 95 %     Weight       Height      Head Circumference      Peak Flow      Pain Score 9     Pain Loc      Pain Education      Exclude from Growth Chart    No data found.  Updated Vital Signs BP 112/79 (BP Location: Left Arm)   Pulse 74   Temp 98.1 F (36.7 C) (Oral)   Resp 18   SpO2 95%   Visual Acuity Right Eye Distance:   Left Eye Distance:   Bilateral Distance:    Right Eye Near:   Left Eye Near:    Bilateral Near:     Physical Exam Vitals and nursing note reviewed.  Constitutional:      Appearance: Normal appearance.  HENT:     Head: Normocephalic and atraumatic.     Right Ear: External ear normal.     Left Ear: External ear normal.     Nose: Nose normal.     Mouth/Throat:     Mouth: Mucous membranes are moist.  Eyes:     Conjunctiva/sclera: Conjunctivae normal.  Cardiovascular:     Rate and Rhythm: Normal rate.     Pulses: Normal pulses.  Pulmonary:     Effort: Pulmonary effort is normal. No respiratory distress.  Musculoskeletal:        General: Swelling and tenderness present. No deformity or signs of injury. Normal range of motion.       Feet:  Skin:    General: Skin is warm and dry.     Capillary Refill: Capillary refill takes less than 2 seconds.     Findings: Erythema present.  Neurological:     General: No focal deficit present.     Mental Status: He is alert.  Psychiatric:        Mood and Affect: Mood normal.      UC Treatments / Results  Labs (all labs ordered are listed, but only abnormal results are displayed) Labs Reviewed - No data to display  EKG   Radiology No results found.  Procedures Procedures (including critical care time)  Medications Ordered in UC Medications  ketorolac (TORADOL) injection 30 mg (has no administration in time range)    Initial Impression / Assessment and Plan / UC Course  I have reviewed the triage vital signs and the nursing notes.  Pertinent labs & imaging results that were available during my care of the  patient were reviewed by me and considered in my medical decision making (see chart for details).  Vitals and triage reviewed, patient is hemodynamically stable.  Presentation consistent with gout, especially due to alcohol and pork intake recently.  Treated with IM Toradol in clinic and steroid burst.  Encouraged dietary modifications.  Plan of care, follow-up care return precautions given, no questions at this time.    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 Tome los esteroides diariamente con el desayuno durante los prximos 5 [place]. Regrese a la clnica ante cualquier sntoma nuevo o urgente.  Avoid beer and pork, as these are known to cause flareups of your gout.  Take the steroids daily with breakfast for the next 5 days. Return to clinic for any new or urgent symptoms.    ED Prescriptions     Medication Sig Dispense Auth. Provider   predniSONE (DELTASONE) 20 MG tablet Take 2 tablets (40 mg total) by mouth daily for 5 days. 10 tablet Sena Clouatre, [place] N, FNP      PDMP not reviewed this encounter.   Shajuan Musso, [place] N, [place] 04/12/23 1958

## 2023-04-12 NOTE — ED Triage Notes (Signed)
 Pt c/o pain to lt foot d/t gout after eating pork and drinking beer this past weekend. States he is out of his prednisone and colchicine.

## 2023-04-25 ENCOUNTER — Encounter (HOSPITAL_COMMUNITY): Payer: Self-pay | Admitting: *Deleted

## 2023-04-25 ENCOUNTER — Ambulatory Visit (INDEPENDENT_AMBULATORY_CARE_PROVIDER_SITE_OTHER): Payer: Self-pay

## 2023-04-25 ENCOUNTER — Ambulatory Visit (HOSPITAL_COMMUNITY)
Admission: EM | Admit: 2023-04-25 | Discharge: 2023-04-25 | Disposition: A | Discharge status: Home / Self Care | Payer: Self-pay | Attending: Emergency Medicine | Admitting: Emergency Medicine

## 2023-04-25 ENCOUNTER — Other Ambulatory Visit: Payer: Self-pay

## 2023-04-25 DIAGNOSIS — M25561 Pain in right knee: Secondary | ICD-10-CM | POA: Insufficient documentation

## 2023-04-25 LAB — CBC WITH DIFFERENTIAL/PLATELET
Abs Immature Granulocytes: 0.06 10*3/uL (ref 0.00–0.07)
Basophils Absolute: 0 10*3/uL (ref 0.0–0.1)
Basophils Relative: 0 %
Eosinophils Absolute: 0.1 10*3/uL (ref 0.0–0.5)
Eosinophils Relative: 1 %
HCT: 47.5 % (ref 39.0–52.0)
Hemoglobin: 16 g/dL (ref 13.0–17.0)
Immature Granulocytes: 1 %
Lymphocytes Relative: 8 %
Lymphs Abs: 0.9 10*3/uL (ref 0.7–4.0)
MCH: 29.6 pg (ref 26.0–34.0)
MCHC: 33.7 g/dL (ref 30.0–36.0)
MCV: 88 fL (ref 80.0–100.0)
Monocytes Absolute: 0.4 10*3/uL (ref 0.1–1.0)
Monocytes Relative: 4 %
Neutro Abs: 9.5 10*3/uL — ABNORMAL HIGH (ref 1.7–7.7)
Neutrophils Relative %: 86 %
Platelets: 245 10*3/uL (ref 150–400)
RBC: 5.4 MIL/uL (ref 4.22–5.81)
RDW: 11.9 % (ref 11.5–15.5)
WBC: 11 10*3/uL — ABNORMAL HIGH (ref 4.0–10.5)
nRBC: 0 % (ref 0.0–0.2)

## 2023-04-25 LAB — COMPREHENSIVE METABOLIC PANEL WITH GFR
ALT: 39 U/L (ref 0–44)
AST: 42 U/L — ABNORMAL HIGH (ref 15–41)
Albumin: 4.7 g/dL (ref 3.5–5.0)
Alkaline Phosphatase: 86 U/L (ref 38–126)
Anion gap: 14 (ref 5–15)
BUN: 11 mg/dL (ref 6–20)
CO2: 24 mmol/L (ref 22–32)
Calcium: 9.7 mg/dL (ref 8.9–10.3)
Chloride: 103 mmol/L (ref 98–111)
Creatinine, Ser: 1.02 mg/dL (ref 0.61–1.24)
GFR, Estimated: >60 mL/min (ref >60)
Glucose, Bld: 103 mg/dL — ABNORMAL HIGH (ref 70–99)
Potassium: 3.5 mmol/L (ref 3.5–5.1)
Sodium: 141 mmol/L (ref 135–145)
Total Bilirubin: 1.1 mg/dL (ref 0.0–1.2)
Total Protein: 8.3 g/dL — ABNORMAL HIGH (ref 6.5–8.1)

## 2023-04-25 LAB — URIC ACID: Uric Acid, Serum: 10 mg/dL — ABNORMAL HIGH (ref 3.7–8.6)

## 2023-04-25 MED ORDER — METHYLPREDNISOLONE ACETATE 80 MG/ML IJ SUSP
INTRAMUSCULAR | Status: AC
  Filled 2023-04-25: qty 1

## 2023-04-25 MED ORDER — METHYLPREDNISOLONE ACETATE 80 MG/ML IJ SUSP
80.0000 mg | Freq: Once | INTRAMUSCULAR | Status: AC
  Administered 2023-04-25: 80 mg via INTRAMUSCULAR

## 2023-04-25 MED ORDER — METHYLPREDNISOLONE 8 MG PO TABS: ORAL_TABLET | ORAL | 0 refills | Status: AC

## 2023-04-25 NOTE — ED Triage Notes (Signed)
 PT refused language assistant for triage. Pt reports RT knee pain started last night Pain 10/10

## 2023-04-25 NOTE — Discharge Instructions (Addendum)
 The x-ray of your knee is consistent with gout and not concerning for any other type of acute inflammatory disease.  Please also be aware that there is evidence of mild arthritis seen in your knee, this is a normal finding for people over the age of 92.  The results of your blood work will be available within the next 24 to 48 hours.  If any changes to your plan of care are recommended based on the results of your blood work, you will be contacted by phone.  In the meantime, steroids are recommended for reduction of pain and inflammation due to gout.  You received an injection of Solu-Medrol during your visit today.  I have also sent a prescription for Solu-Medrol tablets that you can take by mouth over the next 8 days.  Please follow the instructions carefully.  I have included information about gout and foods that you should avoid eating to avoid gout flares.  Thank you for visiting [facility] today.  We appreciate the opportunity to perspired care.

## 2023-04-25 NOTE — ED Provider Notes (Signed)
 [facility]    CSN: [id number] Arrival date & time: 04/25/23  1038    HISTORY   Chief Complaint  Patient presents with   Knee Pain   HPI Darren Rodgers is a pleasant, 43 y.o. male who presents to urgent care today. Patient complains of acute onset of right knee pain last night, rates the pain 10 out of 10.  Patient reports a history of gout, states he likes to eat red meat and drink beer denies excessive alcohol use.  Patient states in the past year he has had multiple gout flares, sometimes in his left great toe and sometimes in his right knee.  Patient states his last gout flare was 3 weeks ago in his left great toe.  Patient states he does not have a PCP.  EMR reviewed by me, last uric acid level was checked 1 year ago and was 10.6.  The history is provided by the patient.  Knee Pain Location:  Knee Time since incident:  1 day Injury: no   Knee location:  R knee Pain details:    Quality:  Unable to specify   Radiates to:  Does not radiate   Severity:  Severe   Onset quality:  Sudden   Timing:  Constant   Progression:  Unchanged Chronicity:  Recurrent Prior injury to area:  No Relieved by:  None tried Ineffective treatments:  None tried  Past Medical History:  Diagnosis Date   Gout    Patient Active Problem List   Diagnosis Date Noted   Right knee pain 12/13/2019   Past Surgical History:  Procedure Laterality Date   FOREARM FRACTURE SURGERY Left     Home Medications    Prior to Admission medications   Not on File    Family History Family History  Problem Relation Age of Onset   Diabetes Father    Social History Social History   Tobacco Use   Smoking status: Never   Smokeless tobacco: Never  Vaping Use   Vaping status: Never Used  Substance Use Topics   Alcohol use: Yes    Comment: on weekends   Drug use: No   Allergies   Patient has no known allergies.  Review of Systems Review of Systems Pertinent findings revealed after  performing a 14 point review of systems has been noted in the history of present illness.  Physical Exam Vital Signs BP 121/84   Pulse 66   Temp 98.4 F (36.9 C)   Resp 20   SpO2 93%   No data found.  Physical Exam Vitals and nursing note reviewed.  Constitutional:      General: He is not in acute distress.    Appearance: Normal appearance. He is normal weight. He is not ill-appearing.  HENT:     Head: Normocephalic and atraumatic.  Eyes:     Extraocular Movements: Extraocular movements intact.     Conjunctiva/sclera: Conjunctivae normal.     Pupils: Pupils are equal, round, and reactive to light.  Cardiovascular:     Rate and Rhythm: Normal rate and regular rhythm.  Pulmonary:     Effort: Pulmonary effort is normal.     Breath sounds: Normal breath sounds.  Musculoskeletal:        General: Normal range of motion.     Cervical back: Normal range of motion and neck supple.     Right knee: Swelling and erythema present. Tenderness present.     Comments: Right knee with warm to touch  Skin:    General: Skin is warm and dry.  Neurological:     General: No focal deficit present.     Mental Status: He is alert and oriented to person, place, and time. Mental status is at baseline.  Psychiatric:        Mood and Affect: Mood normal.        Behavior: Behavior normal.        Thought Content: Thought content normal.        Judgment: Judgment normal.     UC Couse / Diagnostics / Procedures:     Radiology DG Knee Complete 4 Views Right Result Date: 04/25/2023 CLINICAL DATA:  acute onset right knee pain. EXAM: RIGHT KNEE - COMPLETE 4+ VIEW COMPARISON:  None Available. FINDINGS: No acute fracture or dislocation. No aggressive osseous lesion. Mild degenerative changes characterized by decreased medial tibiofemoral compartment joint space. No knee effusion. There is mild diffuse prepatellar/infrapatellar soft tissue swelling. No radiopaque foreign bodies. IMPRESSION: No acute  osseous abnormality of the right knee joint. Mild degenerative changes. Mild diffuse prepatellar/infrapatellar soft tissue swelling. Electronically Signed   By: Beula Brunswick M.D.   On: 04/25/2023 13:02    Procedures Procedures (including critical care time) EKG  Pending results:  Labs Reviewed  CBC WITH DIFFERENTIAL/PLATELET  COMPREHENSIVE METABOLIC PANEL WITH GFR  URIC ACID    Medications Ordered in UC: Medications  methylPREDNISolone acetate (DEPO-MEDROL) injection 80 mg (80 mg Intramuscular Given 04/25/23 1304)    UC Diagnoses / Final Clinical Impressions(s)   I have reviewed the triage vital signs and the nursing notes.  Pertinent labs & imaging results that were available during my care of the patient were reviewed by me and considered in my medical decision making (see chart for details).    Final diagnoses:  Acute pain of right knee   Patient advised of x-ray findings.  Patient provided with an injection of Solu-Medrol during his visit today and advised to take a tapering dose of Solu-Medrol over the next 8 days.  Have checked uric acid level and CBC, will notify patient of results once received.  Discussed with patient recommendation to follow-up with PCP for regular monitoring and management of his gout as directed medications that he can take preventatively.  Patient was provided with education regarding foods he should avoid to limit frequency of gout flares.  Conservative care recommended.  Return precautions advised.  Please see discharge instructions below for details of plan of care as provided to patient. ED Prescriptions     Medication Sig Dispense Auth. Provider   methylPREDNISolone (MEDROL) 8 MG tablet Take 4 tablets (32 mg total) by mouth daily for 2 days, THEN 3 tablets (24 mg total) daily for 2 days, THEN 2 tablets (16 mg total) daily for 2 days, THEN 1 tablet (8 mg total) daily for 2 days. 20 tablet Eloise Hake Scales, PA-C      PDMP not reviewed this  encounter.    Discharge Instructions      The x-ray of your knee is consistent with gout and not concerning for any other type of acute inflammatory disease.  Please also be aware that there is evidence of mild arthritis seen in your knee, this is a normal finding for people over the age of 30.  The results of your blood work will be available within the next 24 to 48 hours.  If any changes to your plan of care are recommended based on the results of your blood work, you  will be contacted by phone.  In the meantime, steroids are recommended for reduction of pain and inflammation due to gout.  You received an injection of Solu-Medrol during your visit today.  I have also sent a prescription for Solu-Medrol tablets that you can take by mouth over the next 8 days.  Please follow the instructions carefully.  I have included information about gout and foods that you should avoid eating to avoid gout flares.  Thank you for visiting [facility] today.  We appreciate the opportunity to perspired care.      Disposition Upon Discharge:  Condition: stable for discharge home Home: take medications as prescribed; routine discharge instructions as discussed; follow up as advised.  Patient presented with an acute illness with associated systemic symptoms and significant discomfort requiring urgent management. In my opinion, this is a condition that a prudent lay person (someone who possesses an average knowledge of health and medicine) may potentially expect to result in complications if not addressed urgently such as respiratory distress, impairment of bodily function or dysfunction of bodily organs.   Routine symptom specific, illness specific and/or disease specific instructions were discussed with the patient and/or caregiver at length.   As such, the patient has been evaluated and assessed, work-up was performed and treatment was provided in alignment with urgent care protocols and  evidence based medicine.  Patient/parent/caregiver has been advised that the patient may require follow up for further testing and treatment if the symptoms continue in spite of treatment, as clinically indicated and appropriate.  Patient/parent/caregiver has been advised to report to orthopedic urgent care clinic or return to the [facility] or PCP in 3-5 days if no better; follow-up with orthopedics, PCP or the Emergency Department if new signs and symptoms develop or if the current signs or symptoms continue to change or worsen for further workup, evaluation and treatment as clinically indicated and appropriate  The patient will follow up with their current PCP if and as advised. If the patient does not currently have a PCP we will have assisted them in obtaining one.   The patient may need specialty follow up if the symptoms continue, in spite of conservative treatment and management, for further workup, evaluation, consultation and treatment as clinically indicated and appropriate.  Patient/parent/caregiver verbalized understanding and agreement of plan as discussed.  All questions were addressed during visit.  Please see discharge instructions below for further details of plan.  This office note has been dictated using Teaching laboratory technician.  Unfortunately, this method of dictation can sometimes lead to typographical or grammatical errors.  I apologize for your inconvenience in advance if this occurs.  Please do not hesitate to reach out to me if clarification is needed.      Eloise Hake Scales, PA-C 04/25/23 1322

## 2023-06-10 ENCOUNTER — Other Ambulatory Visit: Payer: Self-pay

## 2023-06-10 ENCOUNTER — Emergency Department (HOSPITAL_COMMUNITY)
Admission: EM | Admit: 2023-06-10 | Discharge: 2023-06-11 | Disposition: A | Discharge status: Home / Self Care | Payer: Self-pay | Attending: Emergency Medicine | Admitting: Emergency Medicine

## 2023-06-10 DIAGNOSIS — M25562 Pain in left knee: Secondary | ICD-10-CM

## 2023-06-10 DIAGNOSIS — Z760 Encounter for issue of repeat prescription: Secondary | ICD-10-CM | POA: Insufficient documentation

## 2023-06-10 DIAGNOSIS — M109 Gout, unspecified: Secondary | ICD-10-CM

## 2023-06-10 DIAGNOSIS — M10062 Idiopathic gout, left knee: Secondary | ICD-10-CM | POA: Insufficient documentation

## 2023-06-10 NOTE — ED Triage Notes (Signed)
 [place] spanish interpretor used to assist with triage. Pt is here today requesting refill on prednisone . Reports taking steroid for gout to left knee, but ran out, noted to be walking with a limp, Pt denies injury.

## 2023-06-11 ENCOUNTER — Encounter (HOSPITAL_COMMUNITY): Payer: Self-pay | Admitting: Emergency Medicine

## 2023-06-11 MED ORDER — INDOMETHACIN 50 MG PO CAPS: 50.0000 mg | ORAL_CAPSULE | Freq: Three times a day (TID) | ORAL | 0 refills | Status: AC

## 2023-06-11 MED ORDER — PREDNISONE 10 MG (21) PO TBPK: ORAL_TABLET | Freq: Every day | ORAL | 0 refills | Status: DC

## 2023-06-11 NOTE — Discharge Instructions (Addendum)
 I have prescribed anti-inflammatories and steroids.  Please take as directed.  Follow-up with a primary care provider for further management of your frequent gout flares.  If you develop any life-threatening symptoms please return to the emergency department.

## 2023-06-11 NOTE — ED Provider Notes (Signed)
 [facility] EM[facility] AT [facility] Provider Note   CSN: [id number] Arrival date & time: 06/10/23  2235     History  Chief Complaint  Patient presents with   Medication Refill   Knee Pain    Darren Rodgers is a 43 y.o. male.  Patient with history of gout in bilateral knees and ankles presents to the emergency department with complaining of a gout flare of the left knee.  He completes of pain in the left knee with some mild redness and swelling.  He states this is consistent with all of his previous gout flares.  He has been seen previously by urgent cares and prescribed steroids which have helped his symptoms.  He does not have a primary care provider at this time.  He denies any injury to the area.   Medication Refill Knee Pain      Home Medications Prior to Admission medications   Medication Sig Start Date End Date Taking? Authorizing Provider  indomethacin  (INDOCIN ) 50 MG capsule Take 1 capsule (50 mg total) by mouth 3 (three) times daily with meals for 5 days. 06/11/23 06/16/23 Yes Aiya Keach, Antone Batten, PA-C  predniSONE  (STERAPRED UNI-PAK 21 TAB) 10 MG (21) TBPK tablet Take by mouth daily. Take 6 tabs by mouth daily  for 2 days, then 5 tabs for 2 days, then 4 tabs for 2 days, then 3 tabs for 2 days, 2 tabs for 2 days, then 1 tab by mouth daily for 2 days 06/11/23  Yes Elisa Guest, PA-C      Allergies    Patient has no known allergies.    Review of Systems   Review of Systems  Physical Exam Updated Vital Signs BP (!) 128/96 (BP Location: Right Arm)   Pulse 60   Temp 98.2 F (36.8 C)   Resp 15   Ht 5' 2.99" (1.6 m)   Wt 99.8 kg   SpO2 98%   BMI 38.98 kg/m  Physical Exam Vitals and nursing note reviewed.  Constitutional:      General: He is not in acute distress.    Appearance: Normal appearance. He is normal weight. He is not ill-appearing.  HENT:     Head: Normocephalic and atraumatic.  Eyes:     Conjunctiva/sclera: Conjunctivae normal.   Cardiovascular:     Rate and Rhythm: Normal rate and regular rhythm.  Pulmonary:     Effort: Pulmonary effort is normal.     Breath sounds: Normal breath sounds.  Musculoskeletal:        General: Normal range of motion.     Cervical back: Normal range of motion and neck supple.     Right knee: Swelling and erythema present. Tenderness present.     Comments: Left knee with warm to touch  Skin:    General: Skin is warm and dry.  Neurological:     General: No focal deficit present.     Mental Status: He is alert. Mental status is at baseline.  Psychiatric:        Mood and Affect: Mood normal.        Behavior: Behavior normal.        Thought Content: Thought content normal.        Judgment: Judgment normal.     ED Results / Procedures / Treatments   Labs (all labs ordered are listed, but only abnormal results are displayed) Labs Reviewed - No data to display  EKG None  Radiology No results found.  Procedures Procedures    Medications Ordered in ED Medications - No data to display  ED Course/ Medical Decision Making/ A&P                                 Medical Decision Making  This patient presents to the ED for concern of knee pain, this involves an extensive number of treatment options, and is a complaint that carries with it a high risk of complications and morbidity.  The differential diagnosis includes gout flare, arthritis, fracture, dislocation, others   Co morbidities / Chronic conditions that complicate the patient evaluation  Gout   Additional history obtained:  Additional history obtained from EMR External records from outside source obtained and reviewed including urgent care notes    Social Determinants of Health:  Patient has no primary care provider   Test / Admission - Considered:  Patient with history of multiple gout flares.  He states that his pain tonight is consistent with all of his previous gout flares which have successfully  been treated with steroids.  I see no indication based on his history for repeat imaging or lab work this evening.  I plan to treat him with a steroid Dosepak and a course of indomethacin .  I have explained to the patient that he should seek follow-up with a primary care provider.  Patient stable for discharge home.  Return precautions provided.         Final Clinical Impression(s) / ED Diagnoses Final diagnoses:  Acute pain of left knee  Acute gout of left knee, unspecified cause    Rx / DC Orders ED Discharge Orders          Ordered    indomethacin  (INDOCIN ) 50 MG capsule  3 times daily with meals        06/11/23 0415    predniSONE  (STERAPRED UNI-PAK 21 TAB) 10 MG (21) TBPK tablet  Daily        06/11/23 0415              Elisa Guest, PA-C 06/11/23 0416    Eldon Greenland, MD 06/11/23 0510

## 2023-07-26 ENCOUNTER — Ambulatory Visit (HOSPITAL_COMMUNITY)
Admission: EM | Admit: 2023-07-26 | Discharge: 2023-07-26 | Disposition: A | Discharge status: Home / Self Care | Payer: Self-pay | Attending: Physician Assistant | Admitting: Physician Assistant

## 2023-07-26 ENCOUNTER — Encounter (HOSPITAL_COMMUNITY): Payer: Self-pay | Admitting: *Deleted

## 2023-07-26 DIAGNOSIS — M109 Gout, unspecified: Secondary | ICD-10-CM

## 2023-07-26 DIAGNOSIS — M25522 Pain in left elbow: Secondary | ICD-10-CM

## 2023-07-26 MED ORDER — INDOMETHACIN 50 MG PO CAPS
50.0000 mg | ORAL_CAPSULE | Freq: Three times a day (TID) | ORAL | 0 refills | Status: DC
Start: 2023-07-26 — End: 2023-09-05

## 2023-07-26 NOTE — Discharge Instructions (Addendum)
 I believe that you are having a gout flare.  Start indomethacin  3 times daily until your symptoms improve.  Do not take this with additional NSAIDs including aspirin, ibuprofen /Advil , naproxen /Aleve .  You can use Tylenol/acetaminophen as needed for breakthrough pain.  If you develop any worsening symptoms including increasing pain, redness, swelling, numbness or tingling in your hand, fever you need to be seen immediately.  As we discussed, I think you would benefit from starting a medication to lower your uric acid level and try to prevent gout flares.  Contact sports medicine to schedule an appointment.  In the meantime limit alcohol as I believe this is probably triggering your symptoms.  Creo que est teniendo un brote de gota. Comience a tomar indometacina 3 veces al da hasta que sus sntomas mejoren. No la tome junto con otros AINE, como aspirina, ibuprofeno/Advil , naproxeno/Aleve . Puede usar Tylenol/paracetamol segn sea necesario para el dolor irruptivo. Si presenta algn empeoramiento de los sntomas, como aumento del dolor, enrojecimiento, hinchazn, entumecimiento u hormigueo en la mano o fiebre, debe ser examinado de inmediato. Como ya comentamos, creo que le beneficiara comenzar a tomar un medicamento para reducir su nivel de cido rico y tratar de prevenir los brotes de [place]. Contacte con un mdico deportivo para programar una cita. Mientras tanto, limite el consumo de alcohol, ya que creo que probablemente est desencadenando sus sntomas.

## 2023-07-26 NOTE — ED Provider Notes (Signed)
 [facility]    CSN: [id number] Arrival date & time: 07/26/23  1650      History   Chief Complaint Chief Complaint  Patient presents with   Arm Pain    HPI Darren Rodgers is a 43 y.o. male.   Patient presents today with a 24-hour history of left elbow pain.  He reports that pain is rated 8 on a 0-10 pain scale, described as throbbing, worse with extension of the arm or manipulation of the elbow, no alleviating factors identified.  He has not been taking any over-the-counter medication for symptom management.  He denies any recent fall or trauma.  He is left-handed and does have a physically demanding job that requires repeated use of his elbow during the day but denies any changes in his activity that would explain his symptoms.  He does have a history of gout and was last treated for this in May 2025.  He does have elevated uric acid that was last tested 04/25/2023 and elevated at 10.0.  He is not currently in a primary care and is not taking any uric acid lowering medications.  He denies any recent medication changes or addition of thiazide diuretic to his medication regimen.  He does report drinking alcohol and wonders if this could have triggered his symptoms.  He denies any fever, nausea, vomiting.    Past Medical History:  Diagnosis Date   Gout     Patient Active Problem List   Diagnosis Date Noted   Right knee pain 12/13/2019    Past Surgical History:  Procedure Laterality Date   FOREARM FRACTURE SURGERY Left        Home Medications    Prior to Admission medications   Medication Sig Start Date End Date Taking? Authorizing Provider  indomethacin  (INDOCIN ) 50 MG capsule Take 1 capsule (50 mg total) by mouth 3 (three) times daily with meals. 07/26/23  Yes Alekzander Cardell, Rocky POUR, PA-C    Family History Family History  Problem Relation Age of Onset   Diabetes Father     Social History Social History   Tobacco Use   Smoking status: Never   Smokeless  tobacco: Never  Vaping Use   Vaping status: Never Used  Substance Use Topics   Alcohol use: Not Currently    Comment: used to be heavy drinker   Drug use: No     Allergies   Patient has no known allergies.   Review of Systems Review of Systems  Constitutional:  Positive for activity change. Negative for appetite change, fatigue and fever.  Gastrointestinal:  Negative for nausea and vomiting.  Musculoskeletal:  Positive for arthralgias and joint swelling. Negative for myalgias.  Skin:  Negative for color change and wound.  Neurological:  Negative for weakness and numbness.     Physical Exam Triage Vital Signs ED Triage Vitals  Encounter Vitals Group     BP 07/26/23 1804 107/79     Girls Systolic BP Percentile --      Girls Diastolic BP Percentile --      Boys Systolic BP Percentile --      Boys Diastolic BP Percentile --      Pulse Rate 07/26/23 1802 68     Resp 07/26/23 1802 16     Temp 07/26/23 1802 97.7 F (36.5 C)     Temp Source 07/26/23 1802 Oral     SpO2 07/26/23 1802 96 %     Weight --      Height --  Head Circumference --      Peak Flow --      Pain Score --      Pain Loc --      Pain Education --      Exclude from Growth Chart --    No data found.  Updated Vital Signs BP 107/79   Pulse 68   Temp 97.7 F (36.5 C) (Oral)   Resp 18   SpO2 96%   Visual Acuity Right Eye Distance:   Left Eye Distance:   Bilateral Distance:    Right Eye Near:   Left Eye Near:    Bilateral Near:     Physical Exam Vitals reviewed.  Constitutional:      General: He is awake.     Appearance: Normal appearance. He is well-developed. He is not ill-appearing.     Comments: Very pleasant male appears stated age in no acute distress sitting comfortably in exam room  HENT:     Head: Normocephalic and atraumatic.  Cardiovascular:     Rate and Rhythm: Normal rate and regular rhythm.     Heart sounds: Normal heart sounds, S1 normal and S2 normal. No murmur  heard.    Comments: Capillary refill within 2 seconds left fingers Pulmonary:     Effort: Pulmonary effort is normal.     Breath sounds: Normal breath sounds. No stridor. No wheezing, rhonchi or rales.     Comments: Clear to auscultation bilaterally Abdominal:     General: Bowel sounds are normal.     Palpations: Abdomen is soft.     Tenderness: There is no abdominal tenderness.  Musculoskeletal:     Left elbow: Swelling present. Decreased range of motion. Tenderness present.     Comments: Left elbow: Tender to palpation over medial elbow with associated swelling.  Decreased range of motion with flexion and extension secondary to pain.  Hand is neurovascular intact with normal active range of motion at wrist and shoulder.  No deformity noted.  Neurological:     Mental Status: He is alert.  Psychiatric:        Behavior: Behavior is cooperative.      UC Treatments / Results  Labs (all labs ordered are listed, but only abnormal results are displayed) Labs Reviewed - No data to display  EKG   Radiology No results found.  Procedures Procedures (including critical care time)  Medications Ordered in UC Medications - No data to display  Initial Impression / Assessment and Plan / UC Course  I have reviewed the triage vital signs and the nursing notes.  Pertinent labs & imaging results that were available during my care of the patient were reviewed by me and considered in my medical decision making (see chart for details).     Patient is well-appearing, afebrile, nontoxic, nontachycardic.  Low suspicion for septic arthritis given his clinical presentation.  Suspect symptoms are related to acute gout flare.  Will start indomethacin  50 mg up to 3 times a day for 1 week.  No indication for dose adjustment based on metabolic panel from 04/25/2023 with creatinine of 1.02 and calculated creatinine clearance of 110 milliliters per minute.  We discussed that he should follow-up with a  primary care provider as he would likely benefit from uric acid lowering medication given his recurrent gout flares.  He was given the contact information for sports medicine and encouraged to call them to schedule an appointment.  We discussed that if he has any worsening or changing symptoms  including increasing pain, erythema, fever, nausea, vomiting he needs to be seen immediately.  Strict return precautions given.  Excuse note declined.   Final Clinical Impressions(s) / UC Diagnoses   Final diagnoses:  Acute gout of left elbow, unspecified cause  Left elbow pain     Discharge Instructions      I believe that you are having a gout flare.  Start indomethacin  3 times daily until your symptoms improve.  Do not take this with additional NSAIDs including aspirin, ibuprofen /Advil , naproxen /Aleve .  You can use Tylenol/acetaminophen as needed for breakthrough pain.  If you develop any worsening symptoms including increasing pain, redness, swelling, numbness or tingling in your hand, fever you need to be seen immediately.  As we discussed, I think you would benefit from starting a medication to lower your uric acid level and try to prevent gout flares.  Contact sports medicine to schedule an appointment.  In the meantime limit alcohol as I believe this is probably triggering your symptoms.  Creo que est teniendo un brote de gota. Comience a tomar indometacina 3 veces al da hasta que sus sntomas mejoren. No la tome junto con otros AINE, como aspirina, ibuprofeno/Advil , naproxeno/Aleve . Puede usar Tylenol/paracetamol segn sea necesario para el dolor irruptivo. Si presenta algn empeoramiento de los sntomas, como aumento del dolor, enrojecimiento, hinchazn, entumecimiento u hormigueo en la mano o fiebre, debe ser examinado de inmediato. Como ya comentamos, creo que le beneficiara comenzar a tomar un medicamento para reducir su nivel de cido rico y tratar de prevenir los brotes de [place]. Contacte con  un mdico deportivo para programar una cita. Mientras tanto, limite el consumo de alcohol, ya que creo que probablemente est desencadenando sus sntomas.     ED Prescriptions     Medication Sig Dispense Auth. Provider   indomethacin  (INDOCIN ) 50 MG capsule Take 1 capsule (50 mg total) by mouth 3 (three) times daily with meals. 21 capsule Tag Wurtz K, PA-C      PDMP not reviewed this encounter.   Sherrell Rocky POUR, PA-C 07/26/23 2002

## 2023-07-26 NOTE — ED Triage Notes (Signed)
 Pt states he believes he has gout in left elbow. C/O pain and swelling in left elbow onset yesterday. 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

## 2023-08-09 ENCOUNTER — Encounter (HOSPITAL_COMMUNITY): Payer: Self-pay

## 2023-08-09 ENCOUNTER — Ambulatory Visit (HOSPITAL_COMMUNITY)
Admission: EM | Admit: 2023-08-09 | Discharge: 2023-08-09 | Disposition: A | Discharge status: Home / Self Care | Payer: Self-pay | Attending: Emergency Medicine | Admitting: Emergency Medicine

## 2023-08-09 DIAGNOSIS — M109 Gout, unspecified: Secondary | ICD-10-CM

## 2023-08-09 MED ORDER — COLCHICINE 0.6 MG PO TABS: 0.6000 mg | ORAL_TABLET | Freq: Every day | ORAL | 0 refills | Status: DC

## 2023-08-09 MED ORDER — DEXAMETHASONE SODIUM PHOSPHATE 10 MG/ML IJ SOLN
10.0000 mg | Freq: Once | INTRAMUSCULAR | Status: AC
  Administered 2023-08-09: 10 mg via INTRAMUSCULAR

## 2023-08-09 MED ORDER — DEXAMETHASONE SODIUM PHOSPHATE 10 MG/ML IJ SOLN
INTRAMUSCULAR | Status: AC
Start: 2023-08-09 — End: 2023-08-09
  Filled 2023-08-09: qty 1

## 2023-08-09 NOTE — Discharge Instructions (Addendum)
 You received an injection of Decadron  in clinic which is a steroid to help with your pain related to your gout. Start taking 1 tablet of colchicine  once daily for 5 days for additional relief. You can also apply ice to your elbow to help reduce swelling and help with pain. Avoid eating red meat and pork as this could be triggering your gout. Follow-up with the primary care provider that you are established with today for further evaluation management of your chronic gout. Return here as needed.  Recibi una inyeccin de Decadron  en la clnica, un esteroide para Engineer, materials relacionado con la gota. Comience a tomar una tableta de colchicina una vez al da durante 5 das para un alivio adicional. Tambin puede aplicar hielo en el codo para ayudar a reducir la inflamacin y Engineer, materials. Evite comer carne roja y cerdo, ya que podran desencadenar la gota. Acuda hoy mismo a su mdico de cabecera para una evaluacin ms exhaustiva del manejo de su gota crnica. Regrese aqu cuando lo necesite.

## 2023-08-09 NOTE — ED Triage Notes (Signed)
 Pt c/o gout pain to lt elbow since last night. States completed his prednisone  last week. Denies taken meds for pain.

## 2023-08-09 NOTE — ED Provider Notes (Signed)
 [facility]    CSN: [id number] Arrival date & time: 08/09/23  1706      History   Chief Complaint Chief Complaint  Patient presents with   Elbow Pain    HPI Darren Rodgers is a 43 y.o. male.   Patient presents with left elbow pain and swelling that began last night.  Patient states that he has been eating a lot of red meat and pork recently and noticed that this can contribute to gout flares.  Patient states that he finished previously prescribed prednisone  about a week ago that he was prescribed after his ER visit on 5/31.  Patient was seen here on 7/16 for acute gout flare and was prescribed indomethacin  and was recommended to continue with previously prescribed prednisone  for gout.  Patient states that he is not taking any medication today or in the last week for gout.  Patient states that he did not have a primary care provider and would like some assistance establishing with one today for further evaluation management of his gout.  The history is provided by the patient and medical records. The history is limited by a language barrier. No language interpreter was used (Patient declines interpreter, patient speaks [place]).    Past Medical History:  Diagnosis Date   Gout     Patient Active Problem List   Diagnosis Date Noted   Right knee pain 12/13/2019    Past Surgical History:  Procedure Laterality Date   FOREARM FRACTURE SURGERY Left        Home Medications    Prior to Admission medications   Medication Sig Start Date End Date Taking? Authorizing Provider  colchicine  0.6 MG tablet Take 1 tablet (0.6 mg total) by mouth daily for 5 days. 08/09/23 08/14/23 Yes Johnie, Kendricks Reap A, NP  indomethacin  (INDOCIN ) 50 MG capsule Take 1 capsule (50 mg total) by mouth 3 (three) times daily with meals. 07/26/23   Raspet, Rocky POUR, PA-C    Family History Family History  Problem Relation Age of Onset   Diabetes Father     Social History Social History    Tobacco Use   Smoking status: Never   Smokeless tobacco: Never  Vaping Use   Vaping status: Never Used  Substance Use Topics   Alcohol use: Not Currently    Comment: used to be heavy drinker   Drug use: No     Allergies   Patient has no known allergies.   Review of Systems Review of Systems  Per HPI  Physical Exam Triage Vital Signs ED Triage Vitals  Encounter Vitals Group     BP 08/09/23 1738 119/69     Girls Systolic BP Percentile --      Girls Diastolic BP Percentile --      Boys Systolic BP Percentile --      Boys Diastolic BP Percentile --      Pulse Rate 08/09/23 1738 67     Resp 08/09/23 1738 18     Temp 08/09/23 1738 98.6 F (37 C)     Temp Source 08/09/23 1738 Oral     SpO2 08/09/23 1738 96 %     Weight --      Height --      Head Circumference --      Peak Flow --      Pain Score 08/09/23 1739 9     Pain Loc --      Pain Education --      Exclude from [employer]  Chart --    No data found.  Updated Vital Signs BP 119/69 (BP Location: Right Arm)   Pulse 67   Temp 98.6 F (37 C) (Oral)   Resp 18   SpO2 96%   Visual Acuity Right Eye Distance:   Left Eye Distance:   Bilateral Distance:    Right Eye Near:   Left Eye Near:    Bilateral Near:     Physical Exam Vitals and nursing note reviewed.  Constitutional:      General: He is awake. He is not in acute distress.    Appearance: Normal appearance. He is well-developed and well-groomed. He is not ill-appearing.  Musculoskeletal:     Left elbow: Swelling present. No deformity. Normal range of motion. Tenderness present.     Comments: Tenderness and mild swelling noted over left medial elbow.  Without decreased range of motion or obvious deformity  Skin:    General: Skin is warm and dry.  Neurological:     Mental Status: He is alert.  Psychiatric:        Behavior: Behavior is cooperative.      UC Treatments / Results  Labs (all labs ordered are listed, but only abnormal results are  displayed) Labs Reviewed - No data to display  EKG   Radiology No results found.  Procedures Procedures (including critical care time)  Medications Ordered in UC Medications  dexamethasone  (DECADRON ) injection 10 mg (has no administration in time range)    Initial Impression / Assessment and Plan / UC Course  I have reviewed the triage vital signs and the nursing notes.  Pertinent labs & imaging results that were available during my care of the patient were reviewed by me and considered in my medical decision making (see chart for details).     Patient is overall well-appearing.  Vitals are stable.  Exam findings consistent with acute gout flare.  Given IM Decadron  in clinic and prescribed colchicine  for this.  Asked clinical staff to set patient up with PCP appointment prior to discharge.  Discussed follow-up and return precautions. Final Clinical Impressions(s) / UC Diagnoses   Final diagnoses:  Acute gout of left elbow, unspecified cause     Discharge Instructions      You received an injection of Decadron  in clinic which is a steroid to help with your pain related to your gout. Start taking 1 tablet of colchicine  once daily for 5 days for additional relief. You can also apply ice to your elbow to help reduce swelling and help with pain. Avoid eating red meat and pork as this could be triggering your gout. Follow-up with the primary care provider that you are established with today for further evaluation management of your chronic gout. Return here as needed.  Recibi una inyeccin de Decadron  en la clnica, un esteroide para Engineer, materials relacionado con la gota. Comience a tomar una tableta de colchicina una vez al da durante 5 das para un alivio adicional. Tambin puede aplicar hielo en el codo para ayudar a reducir la inflamacin y Engineer, materials. Evite comer carne roja y cerdo, ya que podran desencadenar la gota. Acuda hoy mismo a su mdico de cabecera  para una evaluacin ms exhaustiva del manejo de su gota crnica. Regrese aqu cuando lo necesite.   ED Prescriptions     Medication Sig Dispense Auth. Provider   colchicine  0.6 MG tablet Take 1 tablet (0.6 mg total) by mouth daily for 5 days. 5 tablet [place], [place]  A, NP      PDMP not reviewed this encounter.   Johnie Flaming A, NP 08/09/23 [phone]

## 2023-08-31 ENCOUNTER — Ambulatory Visit: Payer: Self-pay | Admitting: Internal Medicine

## 2023-09-05 ENCOUNTER — Encounter (HOSPITAL_COMMUNITY): Payer: Self-pay | Admitting: Emergency Medicine

## 2023-09-05 ENCOUNTER — Ambulatory Visit (HOSPITAL_COMMUNITY)
Admission: EM | Admit: 2023-09-05 | Discharge: 2023-09-05 | Disposition: A | Discharge status: Home / Self Care | Payer: Self-pay | Attending: Family Medicine | Admitting: Family Medicine

## 2023-09-05 DIAGNOSIS — M109 Gout, unspecified: Secondary | ICD-10-CM

## 2023-09-05 MED ORDER — COLCHICINE 0.6 MG PO TABS: 0.6000 mg | ORAL_TABLET | Freq: Every day | ORAL | 0 refills | Status: DC | PRN

## 2023-09-05 MED ORDER — PREDNISONE 20 MG PO TABS: 40.0000 mg | ORAL_TABLET | Freq: Every day | ORAL | 0 refills | Status: AC

## 2023-09-05 NOTE — ED Provider Notes (Signed)
 [facility]    CSN: [id number] Arrival date & time: 09/05/23  1704      History   Chief Complaint Chief Complaint  Patient presents with   Foot Pain    HPI Darren Rodgers is a 43 y.o. male.    Foot Pain  Here for right foot pain and swelling.  He has a history of gout and this is bothering him again.  NKDA  There is no history of trauma or fall.  He states that he had to move an appointment he had per primary care so that he could possibly get started on some maintenance medication to prevent these frequent gout attacks.  That will actually be happening in October.  Past Medical History:  Diagnosis Date   Gout     Patient Active Problem List   Diagnosis Date Noted   Right knee pain 12/13/2019    Past Surgical History:  Procedure Laterality Date   FOREARM FRACTURE SURGERY Left        Home Medications    Prior to Admission medications   Medication Sig Start Date End Date Taking? Authorizing Provider  colchicine  0.6 MG tablet Take 1 tablet (0.6 mg total) by mouth daily as needed (gout pain). 09/05/23  Yes Vonna Sharlet POUR, MD  predniSONE  (DELTASONE ) 20 MG tablet Take 2 tablets (40 mg total) by mouth daily with breakfast for 5 days. 09/05/23 09/10/23 Yes Vonna Sharlet POUR, MD    Family History Family History  Problem Relation Age of Onset   Diabetes Father     Social History Social History   Tobacco Use   Smoking status: Never   Smokeless tobacco: Never  Vaping Use   Vaping status: Never Used  Substance Use Topics   Alcohol use: Not Currently    Comment: used to be heavy drinker   Drug use: No     Allergies   Patient has no known allergies.   Review of Systems Review of Systems   Physical Exam Triage Vital Signs ED Triage Vitals [09/05/23 1732]  Encounter Vitals Group     BP 139/82     Girls Systolic BP Percentile      Girls Diastolic BP Percentile      Boys Systolic BP Percentile      Boys Diastolic BP Percentile       Pulse Rate 79     Resp 17     Temp 98 F (36.7 C)     Temp Source Oral     SpO2 95 %     Weight      Height      Head Circumference      Peak Flow      Pain Score 8     Pain Loc      Pain Education      Exclude from Growth Chart    No data found.  Updated Vital Signs BP 139/82 (BP Location: Left Arm)   Pulse 79   Temp 98 F (36.7 C) (Oral)   Resp 17   SpO2 95%   Visual Acuity Right Eye Distance:   Left Eye Distance:   Bilateral Distance:    Right Eye Near:   Left Eye Near:    Bilateral Near:     Physical Exam Vitals reviewed.  Constitutional:      General: He is not in acute distress.    Appearance: He is not ill-appearing, toxic-appearing or diaphoretic.  Musculoskeletal:     Comments: There is tenderness  and some mild swelling around the right first MTP and onto the dorsum of the right foot.  Pulses are normal.  Skin:    Coloration: Skin is not jaundiced or pale.  Neurological:     Mental Status: He is alert and oriented to person, place, and time.  Psychiatric:        Behavior: Behavior normal.      UC Treatments / Results  Labs (all labs ordered are listed, but only abnormal results are displayed) Labs Reviewed - No data to display  EKG   Radiology No results found.  Procedures Procedures (including critical care time)  Medications Ordered in UC Medications - No data to display  Initial Impression / Assessment and Plan / UC Course  I have reviewed the triage vital signs and the nursing notes.  Pertinent labs & imaging results that were available during my care of the patient were reviewed by me and considered in my medical decision making (see chart for details).     Visit is conducted in Spanish Prednisone  for 5-day burst is sent to the pharmacy along with some colchicine .  I have printed him a good Rx coupon for the colchicine .  Encouragement given to keep the appointment with primary care for October so that he can possibly end up  on some allopurinol  to prevent these frequent gout attacks. Final Clinical Impressions(s) / UC Diagnoses   Final diagnoses:  Exacerbation of gout     Discharge Instructions      Take prednisone  20 mg--2 daily for 5 days  Colchicine  0.6 mg--1 tablet daily as needed for gout pain  I am glad you are going to see someone to hopefully get you on some maintenance medication to prevent the gout flares.  (Tome prednisona 20 mg: 2 al da durante 5 [address]  Colchicina 0,6 mg: 1 comprimido al da segn sea necesario para el dolor de gota.  Me alegra que vaya a consultar con alguien para que, con suerte, le recete medicacin de mantenimiento para prevenir los brotes de gota.)     ED Prescriptions     Medication Sig Dispense Auth. Provider   predniSONE  (DELTASONE ) 20 MG tablet Take 2 tablets (40 mg total) by mouth daily with breakfast for 5 days. 10 tablet Vonna Sharlet POUR, MD   colchicine  0.6 MG tablet Take 1 tablet (0.6 mg total) by mouth daily as needed (gout pain). 15 tablet Vitalia Stough K, MD      PDMP not reviewed this encounter.   Vonna Sharlet POUR, MD 09/05/23 [phone]

## 2023-09-05 NOTE — Discharge Instructions (Signed)
 Take prednisone  20 mg--2 daily for 5 days  Colchicine  0.6 mg--1 tablet daily as needed for gout pain  I am glad you are going to see someone to hopefully get you on some maintenance medication to prevent the gout flares.  (Tome prednisona 20 mg: 2 al da durante 5 [address]  Colchicina 0,6 mg: 1 comprimido al da segn sea necesario para el dolor de gota.  Me alegra que vaya a consultar con alguien para que, con suerte, le recete medicacin de mantenimiento para prevenir los brotes de gota.)

## 2023-09-05 NOTE — ED Triage Notes (Signed)
 Pt c/o right foot pain that started yesterday. Reports hx gout. Denies any injury to foot. Pt has empty bottle of prednisone  and asking for refill.

## 2023-10-17 ENCOUNTER — Ambulatory Visit: Payer: Self-pay | Admitting: Internal Medicine

## 2023-12-11 ENCOUNTER — Encounter (HOSPITAL_COMMUNITY): Payer: Self-pay | Admitting: *Deleted

## 2023-12-11 ENCOUNTER — Ambulatory Visit (HOSPITAL_COMMUNITY)
Admission: EM | Admit: 2023-12-11 | Discharge: 2023-12-11 | Disposition: A | Discharge status: Home / Self Care | Payer: Self-pay | Attending: Nurse Practitioner | Admitting: Nurse Practitioner

## 2023-12-11 ENCOUNTER — Other Ambulatory Visit: Payer: Self-pay

## 2023-12-11 DIAGNOSIS — M109 Gout, unspecified: Secondary | ICD-10-CM

## 2023-12-11 MED ORDER — PREDNISONE 10 MG PO TABS: ORAL_TABLET | ORAL | 0 refills | Status: AC

## 2023-12-11 MED ORDER — ALLOPURINOL 300 MG PO TABS: ORAL_TABLET | ORAL | 0 refills | Status: DC

## 2023-12-11 NOTE — ED Provider Notes (Signed)
 [facility]    CSN: [id number] Arrival date & time: 12/11/23  9165      History   Chief Complaint Chief Complaint  Patient presents with   Lt great toe pain    HPI Darren Rodgers is a 43 y.o. male.   Patient Spanish speaking but understands and speaks some English. Interpreter services offered but patient decline.   Discussed the use of AI scribe software for clinical note transcription with the patient, who gave verbal consent to proceed.   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 He reports the toe is swollen, tender, and painful, resulting in difficulty ambulating due to pain. He denies any preceding injury or trauma to the area and has not taken any medications for symptom relief. He denies numbness or tingling and describes the symptoms as isolated pain and swelling. He also denies fever or other systemic symptoms.  The patient acknowledges possible dietary triggers, stating, maybe my problem is I drink a lot of alcohol, I eat too much pork and beef. He reports he does not have an established primary care provider and is currently uninsured.  The following sections of the patient's history were reviewed and updated as appropriate: allergies, current medications, past family history, past medical history, past social history, past surgical history, and problem list.     Past Medical History:  Diagnosis Date   Gout     Patient Active Problem List   Diagnosis Date Noted   Right knee pain 12/13/2019    Past Surgical History:  Procedure Laterality Date   FOREARM FRACTURE SURGERY Left        Home Medications    Prior to Admission medications   Medication Sig Start Date End Date Taking? Authorizing Provider  allopurinol  (ZYLOPRIM ) 300 MG tablet Tome 1 tableta (300 mg en total) por via oral al dia para ayudar a controlar la gota a partir del  07/22/2023. 12/17/23  Yes Iola Lukes, FNP  predniSONE  (DELTASONE ) 10 MG tablet Take 6 tablets (60 mg total) by mouth daily with breakfast for 1 day, THEN 5 tablets (50 mg total) daily with breakfast for 1 day, THEN 4 tablets (40 mg total) daily with breakfast for 1 day, THEN 3 tablets (30 mg total) daily with breakfast for 1 day, THEN 2 tablets (20 mg total) daily with breakfast for 1 day. Tome 6 tabletas (60 mg en total) por via oral al dia con el desayuno durante 1 dia, LUEGO 5 tabletas (50 mg en total) al dia con el desayuno durante 1 dia, LUEGO 4 tabletas (40 mg en total) al dia con el desayuno durante 1 dia, LUEGO 3 tabletas (30 mg en total) al dia con el desayuno durante 1 dia, LUEGO 2 tabletas (20 mg en total) al dia con el desayuno durante 1 dia. 12/11/23 12/16/23 Yes Iola Lukes, FNP    Family History Family History  Problem Relation Age of Onset   Diabetes Father     Social History Social History   Tobacco Use   Smoking status: Never   Smokeless tobacco: Never  Vaping Use   Vaping status: Never Used  Substance Use Topics   Alcohol use: Not Currently    Comment: used to be heavy drinker   Drug use: No     Allergies   Patient has no known allergies.   Review of Systems Review of Systems  Constitutional:  Negative for fever.  Musculoskeletal:  Positive for arthralgias (left great toe) and gait problem (due to toe pain).  Neurological:  Negative for numbness.  All other systems reviewed and are negative.    Physical Exam Triage Vital Signs ED Triage Vitals [12/11/23 0913]  Encounter Vitals Group     BP 113/81     Girls Systolic BP Percentile      Girls Diastolic BP Percentile      Boys Systolic BP Percentile      Boys Diastolic BP Percentile      Pulse Rate 69     Resp 20     Temp 97.8 F (36.6 C)     Temp src      SpO2 97 %     Weight      Height      Head Circumference      Peak Flow      Pain Score      Pain Loc      Pain Education       Exclude from Growth Chart    No data found.  Updated Vital Signs BP 113/81   Pulse 69   Temp 97.8 F (36.6 C)   Resp 20   SpO2 97%   Visual Acuity Right Eye Distance:   Left Eye Distance:   Bilateral Distance:    Right Eye Near:   Left Eye Near:    Bilateral Near:     Physical Exam Vitals reviewed.  Constitutional:      General: He is awake. He is not in acute distress.    Appearance: Normal appearance. He is well-developed. He is not ill-appearing, toxic-appearing or diaphoretic.  HENT:     Head: Normocephalic.     Right Ear: Hearing normal.     Left Ear: Hearing normal.     Nose: Nose normal.     Mouth/Throat:     Mouth: Mucous membranes are moist.  Eyes:     General: Vision grossly intact.     Conjunctiva/sclera: Conjunctivae normal.  Cardiovascular:     Rate and Rhythm: Normal rate and regular rhythm.     Pulses:          Dorsalis pedis pulses are 2+ on the left side.     Heart sounds: Normal heart sounds.  Pulmonary:     Effort: Pulmonary effort is normal.     Breath sounds: Normal breath sounds and air entry.  Musculoskeletal:        General: Normal range of motion.     Cervical back: Normal range of motion and neck supple.     Left foot: No deformity.       Feet:  Feet:     Left foot:     Skin integrity: Skin integrity normal.     Toenail Condition: Left toenails are normal.     Comments: Pain and swelling to the left first metatarsophalangeal joint.  No erythema. Limited ROM of the toe due to pain. Pedal pulses normal. Normal skin integrity. No other abnormalities noted within the foot or other toes.  Skin:    General: Skin is warm and dry.  Neurological:     General: No focal deficit present.     Mental Status: He is alert and oriented to person, place, and time.  Psychiatric:        Speech: Speech normal.        Behavior: Behavior is cooperative.      UC Treatments /  Results  Labs (all labs ordered are listed, but only abnormal results  are displayed) Labs Reviewed - No data to display  EKG   Radiology No results found.  Procedures Procedures (including critical care time)  Medications Ordered in UC Medications - No data to display  Initial Impression / Assessment and Plan / UC Course  I have reviewed the triage vital signs and the nursing notes.  Pertinent labs & imaging results that were available during my care of the patient were reviewed by me and considered in my medical decision making (see chart for details).     The patient presents with acute left great toe pain and swelling beginning yesterday, consistent with an acute gout flare involving the left first metatarsophalangeal joint. Symptoms include localized swelling, tenderness, and pain causing difficulty with ambulation, without preceding trauma, neurologic symptoms, or systemic signs of infection. Dietary history is notable for alcohol, pork, and beef intake, which are known gout triggers. Clinical findings support the diagnosis of acute gout without evidence of alternative pathology. The patient was counseled on dietary modifications and provided education on a low-purine diet. A prednisone  taper was prescribed for treatment of the acute flare, and allopurinol  300 mg daily was ordered to begin after completion of the prednisone  course for long-term urate control and flare prevention. Information for [facility] was provided to assist with establishing primary care, particularly given the patient's lack of insurance. The patient was advised to follow up with a primary care provider for ongoing management and laboratory monitoring. He was instructed to seek emergency care for worsening pain or swelling, signs of infection such as fever, redness spreading beyond the joint, inability to bear weight, or development of new neurologic or systemic symptoms.  Today's evaluation has revealed no signs of a dangerous process. Discussed diagnosis with  patient and/or guardian. Patient and/or guardian aware of their diagnosis, possible red flag symptoms to watch out for and need for close follow up. Patient and/or guardian understands verbal and written discharge instructions. Patient and/or guardian comfortable with plan and disposition.  Patient and/or guardian has a clear mental status at this time, good insight into illness (after discussion and teaching) and has clear judgment to make decisions regarding their care  Documentation was completed with the aid of voice recognition software. Transcription may contain typographical errors.  Final Clinical Impressions(s) / UC Diagnoses   Final diagnoses:  Exacerbation of gout     Discharge Instructions      Usted fue atendido hoy por dolor e inflamacin en el dedo gordo del pie izquierdo debido a un ataque agudo de [place]. La gota ocurre cuando se acumula cido rico en el cuerpo y se forman cristales en las articulaciones, lo que causa dolor repentino, enrojecimiento e hinchazn. Sus sntomas coinciden con esta condicin. Se le recet un tratamiento con prednisona para tratar el brote actual y disminuir la inflamacin. Tome este medicamento exactamente como se le indic hasta completarlo. Despus de terminar la prednisona, comience a tomar alopurinol 300 mg una vez al da para ayudar a bear stearns de cido rico y prevenir futuros ataques de gota. No empiece el alopurinol hasta haber completado el tratamiento con prednisona.  Para aliviar los sntomas en casa, descanse el pie afectado, mantngalo elevado cuando sea posible y evite poner presin sobre el dedo. Aplicar hielo durante 15 a 20 minutos varias veces al da puede ayudar a reducir chief technology officer y la inflamacin. Puede usar medicamentos para chief technology officer de venta [place]  acetaminofn (paracetamol), si es necesario, a menos que se le indique lo contrario. Es importante mantenerse bien hidratado y limitar el consumo de alcohol. Reducir  [employer] purinas, como carnes rojas, cerdo, vsceras, mariscos y bebidas azucaradas, tambin puede ayudar a prevenir futuros brotes. Las instrucciones adjuntas contienen ms informacin sobre la dieta baja en purinas.  Es importante que establezca atencin con un mdico de atencin primaria para el manejo continuo de la gota y el control de sus niveles de cido rico. Se le proporcion informacin de [facility], una clnica dirigida especficamente a la comunidad hispana. Es una opcin accesible considerando que no cuenta con seguro mdico. Llame lo antes posible para programar una cita.  Acuda a la sala de emergencias de inmediato si presenta fiebre o escalofros, enrojecimiento que se extiende ms all de la articulacin, dolor intenso que le impide apoyar el pie o caminar, entumecimiento o debilidad nuevos, secrecin de la zona afectada, o si los sntomas empeoran de forma repentina o no mejoran con el tratamiento indicado.  You were seen today for left big toe pain and swelling due to an acute gout flare. Gout is caused by a buildup of uric acid in the body that forms crystals in the joints, leading to sudden pain, redness, and swelling. Your symptoms are consistent with this condition. You were prescribed a prednisone  taper to treat the current flare and reduce inflammation. Take this medication exactly as directed until finished. After completing the prednisone , start taking allopurinol  300 mg daily to help lower uric acid levels and prevent future gout attacks. Do not start the allopurinol  until the prednisone  course is completed. For symptom relief at home, rest the affected foot, elevate it when possible, and avoid putting pressure on the toe. Applying ice for 15-20 minutes at a time several times daily may help reduce pain and swelling. Over-the-counter pain medicines such as acetaminophen may be used if needed unless otherwise directed. Staying well hydrated and limiting  alcohol intake are important. Reducing high-purine foods such as red meat, pork, organ meats, seafood, and sugary drinks can also help prevent future flares. The attached instructions have more information on low-purine diet. It is important to establish care with a primary care provider for ongoing management and monitoring of gout and uric acid levels. You were given information for [facility] which is a clinic specifically for the Hispanic population. It is affordable given that you do not have health insurance. Call as soon as possible to schedule an appointment with them. Seek emergency care immediately if you develop fever or chills, redness spreading beyond the joint, severe pain that prevents any weight-bearing, new numbness or weakness, drainage from the area, or if symptoms suddenly worsen or do not respond to prescribed treatment.     ED Prescriptions     Medication Sig Dispense Auth. Provider   predniSONE  (DELTASONE ) 10 MG tablet Take 6 tablets (60 mg total) by mouth daily with breakfast for 1 day, THEN 5 tablets (50 mg total) daily with breakfast for 1 day, THEN 4 tablets (40 mg total) daily with breakfast for 1 day, THEN 3 tablets (30 mg total) daily with breakfast for 1 day, THEN 2 tablets (20 mg total) daily with breakfast for 1 day. Tome 6 tabletas (60 mg en total) por via oral al dia con el desayuno durante 1 dia, LUEGO 5 tabletas (50 mg en total) al dia con el desayuno durante 1 dia, LUEGO 4 tabletas (40 mg en total) al dia  con el desayuno durante 1 dia, LUEGO 3 tabletas (30 mg en total) al dia con el desayuno durante 1 dia, LUEGO 2 tabletas (20 mg en total) al dia con el desayuno durante 1 dia. 20 tablet Iola Lukes, FNP   allopurinol  (ZYLOPRIM ) 300 MG tablet Tome 1 tableta (300 mg en total) por via oral al dia para ayudar a controlar la gota a partir del 07/22/2023. 30 tablet Iola Lukes, FNP      PDMP not reviewed this encounter.   Iola Rives,  [place] 12/11/23 [id number]

## 2023-12-11 NOTE — Discharge Instructions (Addendum)
 Usted fue atendido hoy por dolor e inflamacin en el dedo gordo del pie izquierdo debido a un ataque agudo de [place]. La gota ocurre cuando se acumula cido rico en el cuerpo y se forman cristales en las articulaciones, lo que causa dolor repentino, enrojecimiento e hinchazn. Sus sntomas coinciden con esta condicin. Se le recet un tratamiento con prednisona para tratar el brote actual y disminuir la inflamacin. Tome este medicamento exactamente como se le indic hasta completarlo. Despus de terminar la prednisona, comience a tomar alopurinol 300 mg una vez al da para ayudar a bear stearns de cido rico y prevenir futuros ataques de gota. No empiece el alopurinol hasta haber completado el tratamiento con prednisona.  Para aliviar los sntomas en casa, descanse el pie afectado, mantngalo elevado cuando sea posible y evite poner presin sobre el dedo. Aplicar hielo durante 15 a 20 minutos varias veces al da puede ayudar a reducir chief technology officer y la inflamacin. Puede usar medicamentos para chief technology officer de venta libre, como acetaminofn (paracetamol), si es necesario, a menos que se le indique lo contrario. Es importante mantenerse bien hidratado y limitar el consumo de alcohol. Reducir [employer] purinas, como carnes rojas, cerdo, vsceras, mariscos y bebidas azucaradas, tambin puede ayudar a prevenir futuros brotes. Las instrucciones adjuntas contienen ms informacin sobre la dieta baja en purinas.  Es importante que establezca atencin con un mdico de atencin primaria para el manejo continuo de la gota y el control de sus niveles de cido rico. Se le proporcion informacin de [facility], una clnica dirigida especficamente a la comunidad hispana. Es una opcin accesible considerando que no cuenta con seguro mdico. Llame lo antes posible para programar una cita.  Acuda a la sala de emergencias de inmediato si presenta fiebre o escalofros, enrojecimiento que se extiende  ms all de la articulacin, dolor intenso que le impide apoyar el pie o caminar, entumecimiento o debilidad nuevos, secrecin de la zona afectada, o si los sntomas empeoran de forma repentina o no mejoran con el tratamiento indicado.  You were seen today for left big toe pain and swelling due to an acute gout flare. Gout is caused by a buildup of uric acid in the body that forms crystals in the joints, leading to sudden pain, redness, and swelling. Your symptoms are consistent with this condition. You were prescribed a prednisone  taper to treat the current flare and reduce inflammation. Take this medication exactly as directed until finished. After completing the prednisone , start taking allopurinol  300 mg daily to help lower uric acid levels and prevent future gout attacks. Do not start the allopurinol  until the prednisone  course is completed. For symptom relief at home, rest the affected foot, elevate it when possible, and avoid putting pressure on the toe. Applying ice for 15-20 minutes at a time several times daily may help reduce pain and swelling. Over-the-counter pain medicines such as acetaminophen may be used if needed unless otherwise directed. Staying well hydrated and limiting alcohol intake are important. Reducing high-purine foods such as red meat, pork, organ meats, seafood, and sugary drinks can also help prevent future flares. The attached instructions have more information on low-purine diet. It is important to establish care with a primary care provider for ongoing management and monitoring of gout and uric acid levels. You were given information for [facility] which is a clinic specifically for the Hispanic population. It is affordable given that you do not have health insurance. Call as soon as possible  to schedule an appointment with them. Seek emergency care immediately if you develop fever or chills, redness spreading beyond the joint, severe pain that prevents any  weight-bearing, new numbness or weakness, drainage from the area, or if symptoms suddenly worsen or do not respond to prescribed treatment.

## 2023-12-11 NOTE — ED Triage Notes (Signed)
 PT reports he has Lt big toe pain from gout with swelling. Pt expected the meds to be handed to him with out seeing a Provider.

## 2023-12-11 NOTE — ED Triage Notes (Signed)
 PT refuses Product Manager .

## 2024-01-07 ENCOUNTER — Encounter (HOSPITAL_COMMUNITY): Payer: Self-pay | Admitting: *Deleted

## 2024-01-07 ENCOUNTER — Ambulatory Visit (HOSPITAL_COMMUNITY)
Admission: EM | Admit: 2024-01-07 | Discharge: 2024-01-07 | Disposition: A | Discharge status: Home / Self Care | Payer: Self-pay | Attending: Nurse Practitioner | Admitting: Nurse Practitioner

## 2024-01-07 ENCOUNTER — Other Ambulatory Visit: Payer: Self-pay

## 2024-01-07 DIAGNOSIS — Z760 Encounter for issue of repeat prescription: Secondary | ICD-10-CM

## 2024-01-07 DIAGNOSIS — M25522 Pain in left elbow: Secondary | ICD-10-CM

## 2024-01-07 DIAGNOSIS — M79671 Pain in right foot: Secondary | ICD-10-CM

## 2024-01-07 DIAGNOSIS — M109 Gout, unspecified: Secondary | ICD-10-CM

## 2024-01-07 MED ORDER — PREDNISONE 20 MG PO TABS: ORAL_TABLET | ORAL | 0 refills | Status: AC

## 2024-01-07 MED ORDER — COLCHICINE 0.6 MG PO TABS: 0.6000 mg | ORAL_TABLET | Freq: Every day | ORAL | 0 refills | Status: AC

## 2024-01-07 NOTE — Discharge Instructions (Addendum)
 Take the prednisone  as prescribed to treat gout.  Also take the colchicine  as needed for gout pain.  Recommend avoid alcohol and follow up with PCP to discuss gout prevention.  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 Tambin tome colchicina segn sea necesario para el dolor de la gota. Se recomienda evitar el alcohol y seguir con su mdico de atencin primaria para discutir la prevencin de la gota.

## 2024-01-07 NOTE — ED Provider Notes (Signed)
 " [facility]    CSN: [id number] Arrival date & time: 01/07/24  1033      History   Chief Complaint Chief Complaint  Patient presents with   Foot Pain    HPI Leonardo Makris is a 43 y.o. male.   Patient presents today for right foot pain that began yesterday.  Reports over the recent holiday, he drank a lot of beer and thinks this may have exacerbated gout.  He denies fevers or nausea/vomiting.  No injury to the foot.  Reports history of gout in the left elbow, left great toe, various body parts.  Does not take gout prevention as he recently missed a couple primary care provider appointments and does not currently have a primary care provider.  Has not taken anything for pain so far.  Patient declines interpreter services today.    Past Medical History:  Diagnosis Date   Gout     Patient Active Problem List   Diagnosis Date Noted   Right knee pain 12/13/2019    Past Surgical History:  Procedure Laterality Date   FOREARM FRACTURE SURGERY Left        Home Medications    Prior to Admission medications  Medication Sig Start Date End Date Taking? Authorizing Provider  colchicine  0.6 MG tablet Take 1 tablet (0.6 mg total) by mouth daily. 01/07/24  Yes Chandra Harlene LABOR, NP  predniSONE  (DELTASONE ) 20 MG tablet Take 3 tablets (60mg ) on days 1-2. Take 2 tablets (40mg ) on days 3-4. Take 1 tablet (20mg ) on days 5-6, then stop. 01/07/24  Yes Chandra Harlene LABOR, NP    Family History Family History  Problem Relation Age of Onset   Diabetes Father     Social History Social History[1]   Allergies   Patient has no known allergies.   Review of Systems Review of Systems Per HPI  Physical Exam Triage Vital Signs ED Triage Vitals  Encounter Vitals Group     BP 01/07/24 1226 109/76     Girls Systolic BP Percentile --      Girls Diastolic BP Percentile --      Boys Systolic BP Percentile --      Boys Diastolic BP Percentile --      Pulse Rate  01/07/24 1226 78     Resp 01/07/24 1226 18     Temp 01/07/24 1226 97.8 F (36.6 C)     Temp src --      SpO2 01/07/24 1226 96 %     Weight --      Height --      Head Circumference --      Peak Flow --      Pain Score 01/07/24 1225 10     Pain Loc --      Pain Education --      Exclude from Growth Chart --    No data found.  Updated Vital Signs BP 109/76   Pulse 78   Temp 97.8 F (36.6 C)   Resp 18   SpO2 96%   Visual Acuity Right Eye Distance:   Left Eye Distance:   Bilateral Distance:    Right Eye Near:   Left Eye Near:    Bilateral Near:     Physical Exam Vitals and nursing note reviewed.  Constitutional:      General: He is not in acute distress.    Appearance: Normal appearance. He is not toxic-appearing.  Pulmonary:     Effort: Pulmonary effort is normal.  No respiratory distress.  Feet:     Comments: Mild diffuse swelling of the right foot.  No redness, bruising, obvious deformity, warmth.  The foot is neurovascular intact distally. Skin:    General: Skin is warm and dry.     Capillary Refill: Capillary refill takes less than 2 seconds.     Coloration: Skin is not jaundiced or pale.     Findings: No erythema.  Neurological:     Mental Status: He is alert and oriented to person, place, and time.  Psychiatric:        Behavior: Behavior is cooperative.      UC Treatments / Results  Labs (all labs ordered are listed, but only abnormal results are displayed) Labs Reviewed - No data to display  EKG   Radiology No results found.  Procedures Procedures (including critical care time)  Medications Ordered in UC Medications - No data to display  Initial Impression / Assessment and Plan / UC Course  I have reviewed the triage vital signs and the nursing notes.  Pertinent labs & imaging results that were available during my care of the patient were reviewed by me and considered in my medical decision making (see chart for details).   Patient is a  well-appearing 43 year old male presenting today for acute exacerbation of gout.  No red flags on exam today and vital signs are stable.  Treat with oral prednisone  and colchicine .  Recommended follow-up with primary care provider and avoidance of alcohol; low purine diet information given.  Return and ER precautions discussed.  The patient was given the opportunity to ask questions.  All questions answered to their satisfaction.  The patient is in agreement to this plan.  Final Clinical Impressions(s) / UC Diagnoses   Final diagnoses:  Foot pain, right  Left elbow pain  Medication refill  Exacerbation of gout     Discharge Instructions      Take the prednisone  as prescribed to treat gout.  Also take the colchicine  as needed for gout pain.  Recommend avoid alcohol and follow up with PCP to discuss gout prevention.  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 Tambin tome colchicina segn sea necesario para el dolor de la gota. Se recomienda evitar el alcohol y seguir con su mdico de atencin primaria para discutir la prevencin de la gota.    ED Prescriptions     Medication Sig Dispense Auth. Provider   colchicine  0.6 MG tablet Take 1 tablet (0.6 mg total) by mouth daily. 15 tablet Chandra Raisin A, NP   predniSONE  (DELTASONE ) 20 MG tablet Take 3 tablets (60mg ) on days 1-2. Take 2 tablets (40mg ) on days 3-4. Take 1 tablet (20mg ) on days 5-6, then stop. 12 tablet Chandra Raisin LABOR, NP      PDMP not reviewed this encounter.    [1]  Social History Tobacco Use   Smoking status: Never   Smokeless tobacco: Never  Vaping Use   Vaping status: Never Used  Substance Use Topics   Alcohol use: Not Currently    Comment: used to be heavy drinker   Drug use: No     Chandra Raisin LABOR, NP 01/07/24 1318  "

## 2024-01-07 NOTE — ED Triage Notes (Signed)
 RT foot pain that started on yesterday. Pt wants refill on Colchicine  for gout.

## 2024-01-07 NOTE — ED Triage Notes (Signed)
 PT refused translator
